# Patient Record
Sex: Female | Born: 1990 | Race: White | Hispanic: No | Marital: Married | Smoking: Never smoker
Health system: Southern US, Community
[De-identification: ages and names within clinical notes are randomized; demographics above are authoritative.]

## PROBLEM LIST (undated history)

## (undated) HISTORY — PX: TONSILLECTOMY: SUR1361

---

## 2017-09-13 DIAGNOSIS — Z124 Encounter for screening for malignant neoplasm of cervix: Secondary | ICD-10-CM | POA: Diagnosis not present

## 2018-02-09 NOTE — L&D Delivery Note (Addendum)
Patient is 28 y.o. G1P0 [redacted]w[redacted]d admitted for ROM.   Delivery Note At 3:11 AM a viable female was delivered via Vaginal, Spontaneous (Presentation: cephalic; LOA).  APGAR: 6, 9.  Placenta status: Spontaneous, intact.  Cord: 3VC.  Anesthesia:  epidural Episiotomy: None Lacerations: small hemostatic 1st degree lacerations of the left sulcus and right periurethral area. No sutures necessary. Suture Repair: none Est. Blood Loss (mL): 42mL   Mom to postpartum.  Baby to Couplet care / Skin to Skin.  Upon arrival patient was complete and pushing. She pushed with good maternal effort to deliver a healthy baby boy. Baby delivered without difficulty, was noted to have good tone and place on maternal abdomen for oral suctioning, drying and stimulation. After roughly 30 seconds of vigorous stimulation, he had not demonstrated any cry or inhalation, he was brought the warmer for further resuscitation.  Following further efforts to suction, and warm him in addition to blow by oxygen, he demonstrated a vigorous cry and improved APGAR score.  He was brought back to mom.  Delayed cord clamping performed. Placenta delivered intact with 3V cord. Vaginal canal and perineum was inspected and found to be hemostatic. Pitocin was started and uterus massaged until bleeding slowed. Counts of sharps, instruments, and lap pads were all correct.   Matilde Haymaker, MD PGY-2 8/23/20203:32 AM   I was present and gloved with the resident for the entire delivery. I agree with the above note.   Marcille Buffy DNP, CNM  10/02/18  4:09 AM

## 2018-02-28 ENCOUNTER — Encounter: Payer: Self-pay | Admitting: *Deleted

## 2018-03-02 ENCOUNTER — Encounter: Payer: Self-pay | Admitting: Obstetrics and Gynecology

## 2018-03-02 ENCOUNTER — Ambulatory Visit (INDEPENDENT_AMBULATORY_CARE_PROVIDER_SITE_OTHER): Payer: BLUE CROSS/BLUE SHIELD | Admitting: Obstetrics and Gynecology

## 2018-03-02 VITALS — BP 110/63 | HR 84 | Ht 64.0 in | Wt 131.0 lb

## 2018-03-02 DIAGNOSIS — Z3400 Encounter for supervision of normal first pregnancy, unspecified trimester: Secondary | ICD-10-CM

## 2018-03-02 DIAGNOSIS — Z3A10 10 weeks gestation of pregnancy: Secondary | ICD-10-CM

## 2018-03-02 DIAGNOSIS — Z3401 Encounter for supervision of normal first pregnancy, first trimester: Secondary | ICD-10-CM | POA: Diagnosis not present

## 2018-03-02 DIAGNOSIS — Z113 Encounter for screening for infections with a predominantly sexual mode of transmission: Secondary | ICD-10-CM | POA: Diagnosis not present

## 2018-03-02 NOTE — Patient Instructions (Signed)
AREA PEDIATRIC/FAMILY PRACTICE PHYSICIANS  [facility] [address], [address] [place], [place]  [address] Phone - [phone]   Fax - [phone]  [facility] [address] [place], [place] [address] Phone - [phone]   Fax - [phone]  [facility] [address] [place], [place]  [address] Phone - [phone]   Fax - [phone]  [facility] [address] [place], [place]  [address] Phone - [phone]   Fax - [phone]  [facility] [address] [place], [place]  [address] Phone - [phone]   Fax - [phone]  [facility] [address] [place], [place]  [address] Phone - [phone]   Fax - [phone]  [facility] [address] [place], [place]  [address] Phone - [phone]   Fax - [phone]  [facility] [address], [address] [place], [place]  [address] Phone - [phone]   Fax - [phone]  [facility] [address] [place], [place]  [address] Phone - [phone]   Fax - [phone] [facility] [address] [place], [place]  [address] Phone - [phone]   Fax - [phone]  [facility] [address] [place], [place]  [address] Phone - [phone]   Fax - [phone]  [facility] [address] [place], [place]  [address] Phone - [phone]   Fax - [phone]  [facility] [address] [place], [place]  [address] Phone - [phone]   Fax - [phone]  [facility] GOSRANI [address] [place], [place]  [address] Phone - [phone]  [facility] [address] [place], [place]  [address] Phone - [phone]   Fax - [phone]  [facility] [address] [place], [place]  [address] Phone - [phone]   Fax - [phone]  [facility] [address] [place], [place]  [address] Phone - [phone]   Fax - [phone]  [facility] [address] [place], [place]  [address] Phone - [phone]   Fax - [phone]   [facility] [address] [place], [place]  [address] Phone - [phone]   Fax - [phone]  [facility] [address] [place], [place]  [address] Phone - [phone]   Fax - [phone]  DAVID RUBIN [address] [place], [place]  [address] Phone - [phone]   Fax - [phone]  [facility] [address] [place], [place]  [address] Phone - [phone]   Fax - [phone]  [facility] [address] [place], [place]  [address] Phone - [phone]   Fax - [phone] [facility] [address] [place], [place]  [address] Phone - [phone]   Fax - [phone]  [facility] [address] [place], [place]  [address] Phone - [phone]   Fax - [phone]  [facility] [address] [place],   [address] Phone - [phone]   Fax - [phone]  [facility] Charlene Flemming MD [address] [place]  [address] Phone [phone]  Fax [phone]   

## 2018-03-02 NOTE — Progress Notes (Signed)
Last pap 09/13/17- negative

## 2018-03-02 NOTE — Progress Notes (Signed)
Subjective:   Christy Durham is a 28 y.o. G1P0 at [redacted]w[redacted]d by LMP being seen today for her first obstetrical visit.  Her obstetrical history is significant for healthy female . Patient does intend to breast feed. Pregnancy history fully reviewed. Desired pregnancy.   Patient reports no complaints.  HISTORY: OB History  Gravida Para Term Preterm AB Living  1 0 0 0 0 0  SAB TAB Ectopic Multiple Live Births  0 0 0 0 0    # Outcome Date GA Lbr Len/2nd Weight Sex Delivery Anes PTL Lv  1 Current             Last pap smear was done 2019 and was normal  No past medical history on file.  Family History  Problem Relation Age of Onset  . Cancer Mother 78       Brease  . Heart defect Maternal Uncle    Social History   Tobacco Use  . Smoking status: Never Smoker  . Smokeless tobacco: Never Used  Substance Use Topics  . Alcohol use: Not Currently  . Drug use: Never   No Known Allergies Current Outpatient Medications on File Prior to Visit  Medication Sig Dispense Refill  . Prenatal Vit-Fe Fumarate-FA (PRENATAL COMPLETE PO) Take by mouth.     No current facility-administered medications on file prior to visit.     Review of Systems Pertinent items noted in HPI and remainder of comprehensive ROS otherwise negative.  Exam   Vitals:   03/02/18 1550 03/02/18 1617  BP: 110/63   Pulse: 84   Weight: 131 lb (59.4 kg)   Height:  5\' 4"  (1.626 m)    Fetal Heart Rate (bpm): 160 Bedside Ultrasound for FHR check: Patient informed that the ultrasound is considered a limited obstetric ultrasound and is not intended to be a complete ultrasound exam.  Patient also informed that the ultrasound is not being completed with the intent of assessing for fetal or placental anomalies or any pelvic abnormalities.  Explained that the purpose of today's ultrasound is to assess for fetal heart rate.  Patient acknowledges the purpose of the exam and the limitations of the study.     BP 110/63    Pulse 84   Ht 5\' 4"  (1.626 m)   Wt 131 lb (59.4 kg)   LMP 12/21/2017   BMI 22.49 kg/m  Uterine Size: size equals dates  System: Breast:  No nipple retraction or dimpling, No nipple discharge or bleeding, No axillary or supraclavicular adenopathy, Normal to palpation without dominant masses   Skin: normal coloration and turgor, no rashes    Neurologic: negative   Extremities: normal strength, tone, and muscle mass   HEENT neck supple with midline trachea and thyroid without masses   Mouth/Teeth mucous membranes moist, pharynx normal without lesions   Neck supple and no masses   Cardiovascular: regular rate and rhythm, no murmurs or gallops   Respiratory:  appears well, vitals normal, no respiratory distress, acyanotic, normal RR, neck free of mass or lymphadenopathy, chest clear, no wheezing, crepitations, rhonchi, normal symmetric air entry   Abdomen: soft, non-tender; bowel sounds normal; no masses,  no organomegaly    Assessment:   Pregnancy: G1P0 Patient Active Problem List   Diagnosis Date Noted  . Supervision of low-risk first pregnancy 03/02/2018     Plan:  1. Supervision of normal first pregnancy, antepartum  - Obstetric panel - HIV antibody (with reflex) - Culture, OB Urine - GC/Chlamydia probe  amp ([place])not at [facility] - [place] bedside; Future - Declines SMA, Cystic fibrosis, and Hemoglobin electrophoresis  - Requested NIPS and AFP  Initial labs drawn. Continue prenatal vitamins. Genetic Screening discussed, NIPS: requested. Ultrasound discussed; fetal anatomic survey: requested. Problem list reviewed and updated. The nature of [facility] with multiple MDs and other Advanced Practice Providers was explained to patient; also emphasized that residents, students are part of our team. Routine obstetric precautions reviewed.   Return in about 5 weeks (around 04/06/2018) for For genetic testing .   Blaze Nylund, Harolyn Rutherford, NP Faculty  Practice Center for [employer], [facility]

## 2018-03-03 ENCOUNTER — Encounter: Payer: Self-pay | Admitting: Obstetrics & Gynecology

## 2018-03-03 LAB — OBSTETRIC PANEL
Absolute Monocytes: 842 cells/uL (ref 200–950)
Antibody Screen: NOT DETECTED
Basophils Absolute: 43 cells/uL (ref 0–200)
Basophils Relative: 0.4 %
Eosinophils Absolute: 119 cells/uL (ref 15–500)
Eosinophils Relative: 1.1 %
HCT: 37.4 % (ref 35.0–45.0)
Hemoglobin: 12.9 g/dL (ref 11.7–15.5)
Hepatitis B Surface Ag: NONREACTIVE
Lymphs Abs: 2430 cells/uL (ref 850–3900)
MCH: 33 pg (ref 27.0–33.0)
MCHC: 34.5 g/dL (ref 32.0–36.0)
MCV: 95.7 fL (ref 80.0–100.0)
MPV: 11.6 fL (ref 7.5–12.5)
Monocytes Relative: 7.8 %
Neutro Abs: 7366 cells/uL (ref 1500–7800)
Neutrophils Relative %: 68.2 %
Platelets: 291 10*3/uL (ref 140–400)
RBC: 3.91 10*6/uL (ref 3.80–5.10)
RDW: 11.9 % (ref 11.0–15.0)
RPR Ser Ql: NONREACTIVE
Rubella: 1.37 index
Total Lymphocyte: 22.5 %
WBC: 10.8 10*3/uL (ref 3.8–10.8)

## 2018-03-03 LAB — HIV ANTIBODY (ROUTINE TESTING W REFLEX): HIV 1&2 Ab, 4th Generation: NONREACTIVE

## 2018-03-03 LAB — GC/CHLAMYDIA PROBE AMP (~~LOC~~) NOT AT ARMC
Chlamydia: NEGATIVE
Neisseria Gonorrhea: NEGATIVE

## 2018-03-04 LAB — URINE CULTURE, OB REFLEX: Organism ID, Bacteria: NO GROWTH

## 2018-03-04 LAB — CULTURE, OB URINE

## 2018-04-06 ENCOUNTER — Encounter: Payer: BLUE CROSS/BLUE SHIELD | Admitting: Obstetrics and Gynecology

## 2018-04-11 ENCOUNTER — Encounter: Payer: Self-pay | Admitting: Obstetrics & Gynecology

## 2018-04-11 ENCOUNTER — Ambulatory Visit (INDEPENDENT_AMBULATORY_CARE_PROVIDER_SITE_OTHER): Payer: BLUE CROSS/BLUE SHIELD | Admitting: Obstetrics & Gynecology

## 2018-04-11 VITALS — BP 123/75 | HR 79 | Wt 135.0 lb

## 2018-04-11 DIAGNOSIS — Z3A15 15 weeks gestation of pregnancy: Secondary | ICD-10-CM | POA: Diagnosis not present

## 2018-04-11 DIAGNOSIS — Z3482 Encounter for supervision of other normal pregnancy, second trimester: Secondary | ICD-10-CM

## 2018-04-11 DIAGNOSIS — Z3480 Encounter for supervision of other normal pregnancy, unspecified trimester: Secondary | ICD-10-CM | POA: Diagnosis not present

## 2018-04-11 NOTE — Progress Notes (Signed)
   PRENATAL VISIT NOTE  Subjective:  Christy Durham is a 28 y.o. G1P0 at [redacted]w[redacted]d being seen today for ongoing prenatal care.  She is currently monitored for the following issues for this low-risk pregnancy and has Supervision of low-risk first pregnancy on their problem list.  Patient reports no complaints.  Contractions: Not present. Vag. Bleeding: None.  Movement: Absent. Denies leaking of fluid.   The following portions of the patient's history were reviewed and updated as appropriate: allergies, current medications, past family history, past medical history, past social history, past surgical history and problem list. Problem list updated.  Objective:   Vitals:   04/11/18 1031  BP: 123/75  Pulse: 79  Weight: 135 lb (61.2 kg)    Fetal Status: Fetal Heart Rate (bpm): 150   Movement: Absent     General:  Alert, oriented and cooperative. Patient is in no acute distress.  Skin: Skin is warm and dry. No rash noted.   Cardiovascular: Normal heart rate noted  Respiratory: Normal respiratory effort, no problems with respiration noted  Abdomen: Soft, gravid, appropriate for gestational age.  Pain/Pressure: Absent     Pelvic: Cervical exam deferred        Extremities: Normal range of motion.  Edema: None  Mental Status: Normal mood and affect. Normal behavior. Normal judgment and thought content.   Assessment and Plan:  Pregnancy: G1P0 at [redacted]w[redacted]d  1. Supervision of other normal pregnancy, antepartum - Genetic Screening - Alpha fetoprotein, maternal - [place] MFM OB DETAIL +14 WK; Future  Preterm labor symptoms and general obstetric precautions including but not limited to vaginal bleeding, contractions, leaking of fluid and fetal movement were reviewed in detail with the patient. Please refer to After Visit Summary for other counseling recommendations.  Return in about 4 weeks (around 05/09/2018).  No future appointments.  Elsie Lincoln, MD

## 2018-04-12 LAB — ALPHA FETOPROTEIN, MATERNAL
AFP MoM: 1.42
AFP, Serum: 48.8 ng/mL
CALC'D GESTATIONAL AGE: 15.9 wk
Maternal Wt: 135 [lb_av]
Risk for ONTD: 1
Twins-AFP: 1

## 2018-04-18 DIAGNOSIS — Z3400 Encounter for supervision of normal first pregnancy, unspecified trimester: Secondary | ICD-10-CM

## 2018-04-27 ENCOUNTER — Encounter (HOSPITAL_COMMUNITY): Payer: Self-pay

## 2018-05-06 ENCOUNTER — Ambulatory Visit (HOSPITAL_COMMUNITY): Payer: BLUE CROSS/BLUE SHIELD

## 2018-05-09 ENCOUNTER — Ambulatory Visit (INDEPENDENT_AMBULATORY_CARE_PROVIDER_SITE_OTHER): Payer: BLUE CROSS/BLUE SHIELD | Admitting: Obstetrics & Gynecology

## 2018-05-09 ENCOUNTER — Other Ambulatory Visit: Payer: Self-pay

## 2018-05-09 DIAGNOSIS — Z3A19 19 weeks gestation of pregnancy: Secondary | ICD-10-CM

## 2018-05-09 DIAGNOSIS — Z3402 Encounter for supervision of normal first pregnancy, second trimester: Secondary | ICD-10-CM

## 2018-05-09 NOTE — Progress Notes (Signed)
   PRENATAL VISIT NOTE  Subjective:  Christy Durham is a 28 y.o. G1P0 at [redacted]w[redacted]d being seen today for ongoing prenatal care.  She is currently monitored for the following issues for this low-risk pregnancy and has Supervision of low-risk first pregnancy on their problem list.  Patient reports no complaints.  Contractions: Not present. Vag. Bleeding: None.  Movement: Present. Denies leaking of fluid.   The following portions of the patient's history were reviewed and updated as appropriate: allergies, current medications, past family history, past medical history, past social history, past surgical history and problem list.   Objective:   Vitals:   05/09/18 1606  BP: 110/70  Pulse: 72  Weight: 143 lb (64.9 kg)    Fetal Status: Fetal Heart Rate (bpm): 151   Movement: Present     General:  Alert, oriented and cooperative. Patient is in no acute distress.  Skin: Skin is warm and dry. No rash noted.   Cardiovascular: Normal heart rate noted  Respiratory: Normal respiratory effort, no problems with respiration noted  Abdomen: Soft, gravid, appropriate for gestational age.  Pain/Pressure: Absent     Pelvic: Cervical exam deferred        Extremities: Normal range of motion.  Edema: None  Mental Status: Normal mood and affect. Normal behavior. Normal judgment and thought content.   Assessment and Plan:  Pregnancy: G1P0 at [redacted]w[redacted]d 1. Encounter for supervision of low-risk first pregnancy in second trimester  Preterm labor symptoms and general obstetric precautions including but not limited to vaginal bleeding, contractions, leaking of fluid and fetal movement were reviewed in detail with the patient. Please refer to After Visit Summary for other counseling recommendations.   Return in about 8 weeks (around 07/04/2018) for for 2 hour GTT.  Future Appointments  Date Time Provider Department Center  05/27/2018  3:00 PM [facility]-MFC NURSE [facility]-US  05/27/2018  3:00 PM WH-[facility] [place] 3 WH-MFCUS M[facility]-US     Allie Bossier, MD

## 2018-05-27 ENCOUNTER — Ambulatory Visit (HOSPITAL_COMMUNITY)
Admission: RE | Admit: 2018-05-27 | Discharge: 2018-05-27 | Disposition: A | Discharge status: Home / Self Care | Payer: BLUE CROSS/BLUE SHIELD | Source: Ambulatory Visit | Attending: Obstetrics and Gynecology | Admitting: Obstetrics and Gynecology

## 2018-05-27 ENCOUNTER — Ambulatory Visit (HOSPITAL_COMMUNITY): Payer: BLUE CROSS/BLUE SHIELD

## 2018-05-27 ENCOUNTER — Other Ambulatory Visit: Payer: Self-pay | Admitting: Obstetrics & Gynecology

## 2018-05-27 ENCOUNTER — Other Ambulatory Visit: Payer: Self-pay

## 2018-05-27 DIAGNOSIS — Z3480 Encounter for supervision of other normal pregnancy, unspecified trimester: Secondary | ICD-10-CM

## 2018-05-27 DIAGNOSIS — Z3A22 22 weeks gestation of pregnancy: Secondary | ICD-10-CM

## 2018-05-27 DIAGNOSIS — Z363 Encounter for antenatal screening for malformations: Secondary | ICD-10-CM | POA: Diagnosis not present

## 2018-07-08 ENCOUNTER — Other Ambulatory Visit: Payer: Self-pay

## 2018-07-08 ENCOUNTER — Ambulatory Visit (INDEPENDENT_AMBULATORY_CARE_PROVIDER_SITE_OTHER): Payer: BLUE CROSS/BLUE SHIELD

## 2018-07-08 VITALS — BP 108/63 | HR 85 | Wt 152.0 lb

## 2018-07-08 DIAGNOSIS — Z3402 Encounter for supervision of normal first pregnancy, second trimester: Secondary | ICD-10-CM | POA: Diagnosis not present

## 2018-07-08 DIAGNOSIS — Z23 Encounter for immunization: Secondary | ICD-10-CM

## 2018-07-08 NOTE — Progress Notes (Signed)
   TELEHEALTH VIRTUAL OBSTETRICS VISIT ENCOUNTER NOTE  I connected with Samuel Germany on 07/08/18 at  8:15 AM EDT by WebEx at home and verified that I am speaking with the correct person using two identifiers.   I discussed the limitations, risks, security and privacy concerns of performing an evaluation and management service by telephone and the availability of in person appointments. I also discussed with the patient that there may be a patient responsible charge related to this service. The patient expressed understanding and agreed to proceed.  Subjective:  Christy Durham is a 28 y.o. G1P0 at [redacted]w[redacted]d being followed for ongoing prenatal care.  She is currently monitored for the following issues for this low-risk pregnancy and has Supervision of low-risk first pregnancy on their problem list.  Patient reports no complaints. Reports fetal movement. Denies any contractions, bleeding or leaking of fluid.   The following portions of the patient's history were reviewed and updated as appropriate: allergies, current medications, past family history, past medical history, past social history, past surgical history and problem list.   Objective:   General:  Alert, oriented and cooperative.   Mental Status: Normal mood and affect perceived. Normal judgment and thought content.  Rest of physical exam deferred due to type of encounter  Assessment and Plan:  Pregnancy: G1P0 at [redacted]w[redacted]d 1. Encounter for supervision of normal first pregnancy in second trimester -BP 108/63 -Encouraged patient to do virtual tour of hospital and sign up for virtual prenatal classes -Anticipatory guidance of next visits reviewed with patient  - 2Hr GTT w/ 1 Hr [place] 75 g - CBC - HIV antibody (with reflex) - RPR   Preterm labor symptoms and general obstetric precautions including but not limited to vaginal bleeding, contractions, leaking of fluid and fetal movement were reviewed in detail with the patient.  I discussed  the assessment and treatment plan with the patient. The patient was provided an opportunity to ask questions and all were answered. The patient agreed with the plan and demonstrated an understanding of the instructions. The patient was advised to call back or seek an in-person office evaluation/go to [facility]U at [facility] for any urgent or concerning symptoms. Please refer to After Visit Summary for other counseling recommendations.   I provided 10 minutes of non-face-to-face time during this encounter.  Return in about 4 weeks (around 08/05/2018) for Return OB visit.  No future appointments.  Rolm Bookbinder, CNM Center for [employer], [facility]

## 2018-07-11 ENCOUNTER — Other Ambulatory Visit: Payer: Self-pay | Admitting: *Deleted

## 2018-07-11 LAB — CBC
HCT: 37.1 % (ref 35.0–45.0)
Hemoglobin: 12.9 g/dL (ref 11.7–15.5)
MCH: 34.2 pg — ABNORMAL HIGH (ref 27.0–33.0)
MCHC: 34.8 g/dL (ref 32.0–36.0)
MCV: 98.4 fL (ref 80.0–100.0)
MPV: 11.4 fL (ref 7.5–12.5)
Platelets: 227 10*3/uL (ref 140–400)
RBC: 3.77 10*6/uL — ABNORMAL LOW (ref 3.80–5.10)
RDW: 12.1 % (ref 11.0–15.0)
WBC: 10.2 10*3/uL (ref 3.8–10.8)

## 2018-07-11 LAB — 2HR GTT W 1 HR, CARPENTER, 75 G
Glucose, 1 Hr, Gest: 83 mg/dL (ref 65–179)
Glucose, 2 Hr, Gest: 102 mg/dL (ref 65–152)
Glucose, Fasting, Gest: 76 mg/dL (ref 65–91)

## 2018-07-11 LAB — RPR: RPR Ser Ql: NONREACTIVE

## 2018-07-11 LAB — HIV ANTIBODY (ROUTINE TESTING W REFLEX): HIV 1&2 Ab, 4th Generation: NONREACTIVE

## 2018-08-05 ENCOUNTER — Other Ambulatory Visit: Payer: Self-pay

## 2018-08-05 ENCOUNTER — Telehealth (INDEPENDENT_AMBULATORY_CARE_PROVIDER_SITE_OTHER): Payer: BC Managed Care – PPO

## 2018-08-05 DIAGNOSIS — Z3400 Encounter for supervision of normal first pregnancy, unspecified trimester: Secondary | ICD-10-CM

## 2018-08-05 DIAGNOSIS — Z3403 Encounter for supervision of normal first pregnancy, third trimester: Secondary | ICD-10-CM

## 2018-08-05 NOTE — Progress Notes (Signed)
   TELEHEALTH VIRTUAL OBSTETRICS VISIT ENCOUNTER NOTE  I connected with Christy Durham on 08/05/18 at  8:45 AM EDT by MyChart Virtual Visit at home and verified that I am speaking with the correct person using two identifiers.   I discussed the limitations, risks, security and privacy concerns of performing an evaluation and management service by telephone and the availability of in person appointments. I also discussed with the patient that there may be a patient responsible charge related to this service. The patient expressed understanding and agreed to proceed.  Subjective:  Christy Durham is a 28 y.o. G1P0 at [redacted]w[redacted]d being followed for ongoing prenatal care.  She is currently monitored for the following issues for this low-risk pregnancy and has Supervision of low-risk first pregnancy on their problem list.  Patient reports increased swelling in feet after work and restless legs at night. Reports fetal movement. Denies any contractions, bleeding or leaking of fluid.   The following portions of the patient's history were reviewed and updated as appropriate: allergies, current medications, past family history, past medical history, past social history, past surgical history and problem list.   Objective:   General:  Alert, oriented and cooperative.   Mental Status: Normal mood and affect perceived. Normal judgment and thought content.  Rest of physical exam deferred due to type of encounter  Assessment and Plan:  Pregnancy: G1P0 at [redacted]w[redacted]d 1. Encounter for supervision of low-risk first pregnancy, antepartum -Patient at work and unable to check BP. Will check tonight and put in [place]. Reviewed previous BPs and all normal -Discussed coping measures for RLS and encouraged patient to increase PO hydration. -Reassured of normalcy of swelling after being on her feet for long hours. Warning signs reviewed with patient -Anticipatory guidance of next visits reviewed with patient including GBS testing.    Preterm labor symptoms and general obstetric precautions including but not limited to vaginal bleeding, contractions, leaking of fluid and fetal movement were reviewed in detail with the patient.  I discussed the assessment and treatment plan with the patient. The patient was provided an opportunity to ask questions and all were answered. The patient agreed with the plan and demonstrated an understanding of the instructions. The patient was advised to call back or seek an in-person office evaluation/go to MAU at [facility] for any urgent or concerning symptoms. Please refer to After Visit Summary for other counseling recommendations.   I provided 20 minutes of non-face-to-face time during this encounter.  Return in about 4 weeks (around 09/02/2018) for Return OB visit.  No future appointments.  Wende Mott, [place] for [employer], [place]

## 2018-08-05 NOTE — Progress Notes (Signed)
Pt cannot take BP right now because she is at work but she will take it when she gets home

## 2018-09-02 ENCOUNTER — Other Ambulatory Visit: Payer: Self-pay

## 2018-09-02 ENCOUNTER — Ambulatory Visit (INDEPENDENT_AMBULATORY_CARE_PROVIDER_SITE_OTHER): Payer: BC Managed Care – PPO | Admitting: Certified Nurse Midwife

## 2018-09-02 VITALS — BP 108/61 | HR 83 | Wt 161.0 lb

## 2018-09-02 DIAGNOSIS — Z3403 Encounter for supervision of normal first pregnancy, third trimester: Secondary | ICD-10-CM | POA: Diagnosis not present

## 2018-09-02 DIAGNOSIS — Z113 Encounter for screening for infections with a predominantly sexual mode of transmission: Secondary | ICD-10-CM

## 2018-09-02 DIAGNOSIS — Z3A36 36 weeks gestation of pregnancy: Secondary | ICD-10-CM

## 2018-09-02 NOTE — Progress Notes (Signed)
Subjective:  Christy Durham is a 28 y.o. G1P0 at [redacted]w[redacted]d being seen today for ongoing prenatal care.  She is currently monitored for the following issues for this low-risk pregnancy and has Supervision of low-risk first pregnancy on their problem list.  Patient reports no complaints.  Contractions: Not present. Vag. Bleeding: None.  Movement: Present. Denies leaking of fluid.   The following portions of the patient's history were reviewed and updated as appropriate: allergies, current medications, past family history, past medical history, past social history, past surgical history and problem list. Problem list updated.  Objective:   Vitals:   09/02/18 0819  BP: 108/61  Pulse: 83  Weight: 161 lb (73 kg)    Fetal Status: Fetal Heart Rate (bpm): 148 Fundal Height: 37 cm Movement: Present  Presentation: Vertex  General:  Alert, oriented and cooperative. Patient is in no acute distress.  Skin: Skin is warm and dry. No rash noted.   Cardiovascular: Normal heart rate noted  Respiratory: Normal respiratory effort, no problems with respiration noted  Abdomen: Soft, gravid, appropriate for gestational age. Pain/Pressure: Present     Pelvic: Vag. Bleeding: None Vag D/C Character: Thin   Cervical exam deferred        Extremities: Normal range of motion.  Edema: Trace  Mental Status: Normal mood and affect. Normal behavior. Normal judgment and thought content.   Urinalysis:      Assessment and Plan:  Pregnancy: G1P0 at 104w3d  1. Encounter for supervision of normal first pregnancy in third trimester - Culture, beta strep (group b only) - Cervicovaginal ancillary only( [place])   Term labor symptoms and general obstetric precautions including but not limited to vaginal bleeding, contractions, leaking of fluid and fetal movement were reviewed in detail with the patient. Please refer to After Visit Summary for other counseling recommendations.  Return in about 1 week (around  09/09/2018).   Julianne Handler, CNM

## 2018-09-03 LAB — CERVICOVAGINAL ANCILLARY ONLY
Chlamydia: NEGATIVE
Neisseria Gonorrhea: NEGATIVE

## 2018-09-05 LAB — CULTURE, BETA STREP (GROUP B ONLY)
MICRO NUMBER:: 701467
SPECIMEN QUALITY:: ADEQUATE

## 2018-09-09 ENCOUNTER — Encounter: Payer: Self-pay | Admitting: Certified Nurse Midwife

## 2018-09-09 ENCOUNTER — Telehealth (INDEPENDENT_AMBULATORY_CARE_PROVIDER_SITE_OTHER): Payer: BC Managed Care – PPO | Admitting: Certified Nurse Midwife

## 2018-09-09 ENCOUNTER — Other Ambulatory Visit: Payer: Self-pay

## 2018-09-09 DIAGNOSIS — Z3A37 37 weeks gestation of pregnancy: Secondary | ICD-10-CM

## 2018-09-09 DIAGNOSIS — Z3403 Encounter for supervision of normal first pregnancy, third trimester: Secondary | ICD-10-CM

## 2018-09-09 NOTE — Progress Notes (Signed)
Pt will take BP when she gets home from work and she will put it into [employer].

## 2018-09-09 NOTE — Progress Notes (Signed)
   [place] VIRTUAL VIDEO VISIT ENCOUNTER NOTE  Provider location: Center for [employer] at [place]   I connected with Christy Durham on 09/09/18 at 10:45 AM EDT by MyChart Video Encounter at home and verified that I am speaking with the correct person using two identifiers.   I discussed the limitations, risks, security and privacy concerns of performing an evaluation and management service virtually and the availability of in person appointments. I also discussed with the patient that there may be a patient responsible charge related to this service. The patient expressed understanding and agreed to proceed. Subjective:  Christy Durham is a 28 y.o. G1P0 at [redacted]w[redacted]d being seen today for ongoing prenatal care.  She is currently monitored for the following issues for this low-risk pregnancy and has Supervision of low-risk first pregnancy on their problem list.  Patient reports no complaints.  Contractions: Not present. Vag. Bleeding: None.  Movement: Present. Denies any leaking of fluid.   The following portions of the patient's history were reviewed and updated as appropriate: allergies, current medications, past family history, past medical history, past social history, past surgical history and problem list.   Objective:  There were no vitals filed for this visit. - did not have cuff with her, will check BP later today   Fetal Status:     Movement: Present     General:  Alert, oriented and cooperative. Patient is in no acute distress.  Respiratory: Normal respiratory effort, no problems with respiration noted  Mental Status: Normal mood and affect. Normal behavior. Normal judgment and thought content.  Rest of physical exam deferred due to type of encounter  Imaging: No results found.  Assessment and Plan:  Pregnancy: G1P0 at [redacted]w[redacted]d 1. Encounter for supervision of low-risk first pregnancy in third trimester  Term labor symptoms and general obstetric  precautions including but not limited to vaginal bleeding, contractions, leaking of fluid and fetal movement were reviewed in detail with the patient. I discussed the assessment and treatment plan with the patient. The patient was provided an opportunity to ask questions and all were answered. The patient agreed with the plan and demonstrated an understanding of the instructions. The patient was advised to call back or seek an in-person office evaluation/go to [facility]U at [facility] for any urgent or concerning symptoms. Please refer to After Visit Summary for other counseling recommendations.   I provided 6 minutes of face-to-face time during this encounter.  Return in about 1 week (around 09/16/2018).- virtual  No future appointments.  Christy Durham, [place] for [place]

## 2018-09-16 ENCOUNTER — Telehealth (INDEPENDENT_AMBULATORY_CARE_PROVIDER_SITE_OTHER): Payer: BC Managed Care – PPO

## 2018-09-16 DIAGNOSIS — Z3403 Encounter for supervision of normal first pregnancy, third trimester: Secondary | ICD-10-CM

## 2018-09-16 DIAGNOSIS — Z3A38 38 weeks gestation of pregnancy: Secondary | ICD-10-CM

## 2018-09-16 NOTE — Progress Notes (Signed)
   TELEHEALTH VIRTUAL OBSTETRICS VISIT ENCOUNTER NOTE  I connected with Christy Durham on 09/16/18 at  8:45 AM EDT by MyChart Virtual Visit at home and verified that I am speaking with the correct person using two identifiers.   I discussed the limitations, risks, security and privacy concerns of performing an evaluation and management service by telephone and the availability of in person appointments. I also discussed with the patient that there may be a patient responsible charge related to this service. The patient expressed understanding and agreed to proceed.  Subjective:  Christy Durham is a 28 y.o. G1P0 at [redacted]w[redacted]d being followed for ongoing prenatal care.  She is currently monitored for the following issues for this low-risk pregnancy and has Supervision of low-risk first pregnancy on their problem list.  Patient reports no complaints. Reports fetal movement. Denies any contractions, bleeding or leaking of fluid.   The following portions of the patient's history were reviewed and updated as appropriate: allergies, current medications, past family history, past medical history, past social history, past surgical history and problem list.   Objective:   General:  Alert, oriented and cooperative.   Mental Status: Normal mood and affect perceived. Normal judgment and thought content.  Rest of physical exam deferred due to type of encounter  Assessment and Plan:  Pregnancy: G1P0 at [redacted]w[redacted]d 1. Encounter for supervision of low-risk first pregnancy in third trimester -BP 113/75 -Patient doing well without complaints -Counseled patient on routine COVID-19 testing for all admission to inpatient units whether direct admit or from MAU/ED. Reviewed reasons for testing including identifying cases and protecting patients/staff from possible infection. Reassured patient that separation from newborn is not required for COVID+ tests in asymptomatic patients and that the plan of care will be created with Peds  through shared decision-making. One support person is allowed for all admitted patients regardless of COVID test results. All patient questions answered.   -Reviewed schedule of visits for remainder of pregnancy  Term labor symptoms and general obstetric precautions including but not limited to vaginal bleeding, contractions, leaking of fluid and fetal movement were reviewed in detail with the patient.  I discussed the assessment and treatment plan with the patient. The patient was provided an opportunity to ask questions and all were answered. The patient agreed with the plan and demonstrated an understanding of the instructions. The patient was advised to call back or seek an in-person office evaluation/go to MAU at [facility] for any urgent or concerning symptoms. Please refer to After Visit Summary for other counseling recommendations.   I provided 10 minutes of non-face-to-face time during this encounter.  Return in about 1 week (around 09/23/2018) for Return OB visit.  Future Appointments  Date Time Provider [place]  09/23/2018  9:30 AM Julianne Handler, [place], [place] for [employer], [place]

## 2018-09-23 ENCOUNTER — Telehealth (INDEPENDENT_AMBULATORY_CARE_PROVIDER_SITE_OTHER): Payer: BC Managed Care – PPO | Admitting: Certified Nurse Midwife

## 2018-09-23 DIAGNOSIS — Z3403 Encounter for supervision of normal first pregnancy, third trimester: Secondary | ICD-10-CM

## 2018-09-23 DIAGNOSIS — Z3A39 39 weeks gestation of pregnancy: Secondary | ICD-10-CM

## 2018-09-23 NOTE — Progress Notes (Signed)
Pt will take BP and enter into Babyscripts today

## 2018-09-23 NOTE — Progress Notes (Addendum)
   TELEHEALTH VIRTUAL OBSTETRICS VISIT ENCOUNTER NOTE  I connected with Christy Durham on 09/23/18 at  9:30 AM EDT by telephone at home and verified that I am speaking with the correct person using two identifiers.   I discussed the limitations, risks, security and privacy concerns of performing an evaluation and management service by telephone and the availability of in person appointments. I also discussed with the patient that there may be a patient responsible charge related to this service. The patient expressed understanding and agreed to proceed.  Subjective:  Christy Durham is a 28 y.o. G1P0 at [redacted]w[redacted]d being followed for ongoing prenatal care.  She is currently monitored for the following issues for this low-risk pregnancy and has Supervision of low-risk first pregnancy on their problem list.  Patient reports no complaints. Had some pain her hips earlier in the week when she woke up, but none now. Reports fetal movement. Denies any contractions, bleeding or leaking of fluid.   The following portions of the patient's history were reviewed and updated as appropriate: allergies, current medications, past family history, past medical history, past social history, past surgical history and problem list.   Objective:  BP 116/70 General:  Alert, oriented and cooperative.   Mental Status: Normal mood and affect perceived. Normal judgment and thought content.  Rest of physical exam deferred due to type of encounter  Assessment and Plan:  Pregnancy: G1P0 at [redacted]w[redacted]d 1. Encounter for supervision of low-risk first pregnancy in third trimester -- Follow up in 1 week for NST/AFI -- Plan for IOL at 41 weeks  Term labor symptoms and general obstetric precautions including but not limited to vaginal bleeding, contractions, leaking of fluid and fetal movement were reviewed in detail with the patient.  I discussed the assessment and treatment plan with the patient. The patient was provided an opportunity to  ask questions and all were answered. The patient agreed with the plan and demonstrated an understanding of the instructions. The patient was advised to call back or seek an in-person office evaluation/go to MAU at [facility] for any urgent or concerning symptoms. Please refer to After Visit Summary for other counseling recommendations.   I provided 9 minutes of non-face-to-face time during this encounter.  Return in about 1 week (around 09/30/2018).  No future appointments.  Maryagnes Amos, [place] for [employer], [place]

## 2018-09-27 ENCOUNTER — Other Ambulatory Visit: Payer: Self-pay

## 2018-09-27 ENCOUNTER — Ambulatory Visit (INDEPENDENT_AMBULATORY_CARE_PROVIDER_SITE_OTHER): Payer: BC Managed Care – PPO | Admitting: Advanced Practice Midwife

## 2018-09-27 ENCOUNTER — Encounter (HOSPITAL_COMMUNITY): Payer: Self-pay | Admitting: *Deleted

## 2018-09-27 ENCOUNTER — Telehealth (HOSPITAL_COMMUNITY): Payer: Self-pay | Admitting: *Deleted

## 2018-09-27 VITALS — BP 116/74 | HR 80 | Wt 165.0 lb

## 2018-09-27 DIAGNOSIS — Z3A40 40 weeks gestation of pregnancy: Secondary | ICD-10-CM | POA: Diagnosis not present

## 2018-09-27 DIAGNOSIS — Z3403 Encounter for supervision of normal first pregnancy, third trimester: Secondary | ICD-10-CM | POA: Diagnosis not present

## 2018-09-27 NOTE — Telephone Encounter (Signed)
Preadmission screen  

## 2018-09-27 NOTE — Addendum Note (Signed)
Addended by: [name] A on: 09/27/2018 02:23 PM   Modules accepted: Orders, SmartSet

## 2018-09-27 NOTE — Progress Notes (Signed)
   PRENATAL VISIT NOTE  Subjective:  Christy Durham is a 28 y.o. G1P0 at [redacted]w[redacted]d being seen today for ongoing prenatal care.  She is currently monitored for the following issues for this low-risk pregnancy and has Supervision of low-risk first pregnancy on their problem list.  Patient reports intermittent backache, more at night and pelvic pressure. Contractions: reports tightening in abdomen but no pain. Vag. Bleeding: None. Movement: Present. Denies leaking of fluid.   The following portions of the patient's history were reviewed and updated as appropriate: allergies, current medications, past family history, past medical history, past social history, past surgical history and problem list.   Objective:   Vitals:   09/27/18 1017  BP: 116/74  Pulse: 80  Weight: 74.8 kg    Fetal Status:     Movement: Present  Presentation: Vertex  General:  Alert, oriented and cooperative. Patient is in no acute distress.  Skin: Skin is warm and dry. No rash noted.   Cardiovascular: Normal heart rate noted  Respiratory: Normal respiratory effort, no problems with respiration noted  Abdomen: Soft, gravid, appropriate for gestational age.  Pain/Pressure: Absent     Pelvic: Cervical exam performed FT/80/-1, vertex  Extremities: Normal range of motion.  Edema: Trace  Mental Status: Normal mood and affect. Normal behavior. Normal judgment and thought content.   Assessment and Plan:  Pregnancy: G1P0 at [redacted]w[redacted]d 1. Encounter for supervision of low-risk first pregnancy in third trimester - Cervical exam performed. FT/80/-1, posterior, soft, vertex. - Discussed membrane sweeping, but unable as cervix is FT.  -Discussed the use of evening primrose oil and the [employer] for cervical ripening -IOL scheduled for Tuesday morning 10/04/18 - Pt to come to office Monday afternoon 10/03/18 for foley bulb placement  Term labor symptoms and general obstetric precautions including but not limited to vaginal bleeding,  contractions, leaking of fluid and fetal movement were reviewed in detail with the patient. Please refer to After Visit Summary for other counseling recommendations.   No follow-ups on file.  No future appointments.  Maryagnes Amos, SNM

## 2018-09-30 ENCOUNTER — Other Ambulatory Visit: Payer: Self-pay

## 2018-09-30 ENCOUNTER — Inpatient Hospital Stay (HOSPITAL_COMMUNITY)
Admission: AD | Admit: 2018-09-30 | Discharge: 2018-09-30 | Disposition: A | Discharge status: Home / Self Care | Payer: BC Managed Care – PPO | Source: Home / Self Care | Attending: Obstetrics and Gynecology | Admitting: Obstetrics and Gynecology

## 2018-09-30 ENCOUNTER — Other Ambulatory Visit: Payer: Self-pay | Admitting: Advanced Practice Midwife

## 2018-09-30 ENCOUNTER — Encounter (HOSPITAL_COMMUNITY): Payer: Self-pay | Admitting: *Deleted

## 2018-09-30 DIAGNOSIS — Z20828 Contact with and (suspected) exposure to other viral communicable diseases: Secondary | ICD-10-CM | POA: Diagnosis not present

## 2018-09-30 DIAGNOSIS — Z3A00 Weeks of gestation of pregnancy not specified: Secondary | ICD-10-CM | POA: Insufficient documentation

## 2018-09-30 DIAGNOSIS — Z3403 Encounter for supervision of normal first pregnancy, third trimester: Secondary | ICD-10-CM

## 2018-09-30 DIAGNOSIS — O480 Post-term pregnancy: Secondary | ICD-10-CM | POA: Diagnosis not present

## 2018-09-30 DIAGNOSIS — O479 False labor, unspecified: Secondary | ICD-10-CM | POA: Insufficient documentation

## 2018-09-30 DIAGNOSIS — Z3A41 41 weeks gestation of pregnancy: Secondary | ICD-10-CM | POA: Diagnosis not present

## 2018-09-30 DIAGNOSIS — O321XX0 Maternal care for breech presentation, not applicable or unspecified: Secondary | ICD-10-CM | POA: Diagnosis not present

## 2018-09-30 DIAGNOSIS — Z3A40 40 weeks gestation of pregnancy: Secondary | ICD-10-CM | POA: Diagnosis not present

## 2018-09-30 MED ORDER — ZOLPIDEM TARTRATE 5 MG PO TABS
5.0000 mg | ORAL_TABLET | Freq: Once | ORAL | Status: AC
  Administered 2018-09-30: 23:00:00 5 mg via ORAL
  Filled 2018-09-30: qty 1

## 2018-09-30 MED ORDER — OXYCODONE HCL 5 MG PO TABS
5.0000 mg | ORAL_TABLET | Freq: Once | ORAL | Status: AC
  Administered 2018-09-30: 5 mg via ORAL
  Filled 2018-09-30: qty 1

## 2018-09-30 NOTE — OB Triage Note (Signed)
I have communicated with Dr. Matilde Haymaker, resident and reviewed vital signs:  Vitals:   09/30/18 1953 09/30/18 2236  BP: 135/72 135/72  Pulse: 93 72  Resp: 20   Temp: 97.8 F (36.6 C)     Vaginal exam:  Dilation: 1.5 Effacement (%): 70, 80 Station: -1 Presentation: Vertex Exam by:: Maryagnes Amos, rn,   Also reviewed contraction pattern and that non-stress test is reactive.  It has been documented that patient is contracting every 2-5.5 minutes with no cervical change over 3 hours not indicating active labor.  Patient denies any other complaints.  Based on this report provider has given order for discharge.  A discharge order and diagnosis entered by a provider.   Labor discharge instructions reviewed with patient.

## 2018-09-30 NOTE — MAU Note (Signed)
Contractions since 0200, denies ROM, endorses positive FM

## 2018-09-30 NOTE — Discharge Instructions (Signed)
Braxton Hicks Contractions Contractions of the uterus can occur throughout pregnancy, but they are not always a sign that you are in labor. You may have practice contractions called Braxton Hicks contractions. These false labor contractions are sometimes confused with true labor. What are Braxton Hicks contractions? Braxton Hicks contractions are tightening movements that occur in the muscles of the uterus before labor. Unlike true labor contractions, these contractions do not result in opening (dilation) and thinning of the cervix. Toward the end of pregnancy (32-34 weeks), Braxton Hicks contractions can happen more often and may become stronger. These contractions are sometimes difficult to tell apart from true labor because they can be very uncomfortable. You should not feel embarrassed if you go to the hospital with false labor. Sometimes, the only way to tell if you are in true labor is for your health care provider to look for changes in the cervix. The health care provider will do a physical exam and may monitor your contractions. If you are not in true labor, the exam should show that your cervix is not dilating and your water has not broken. If there are no other health problems associated with your pregnancy, it is completely safe for you to be sent home with false labor. You may continue to have Braxton Hicks contractions until you go into true labor. How to tell the difference between true labor and false labor True labor  Contractions last 30-70 seconds.  Contractions become very regular.  Discomfort is usually felt in the top of the uterus, and it spreads to the lower abdomen and low back.  Contractions do not go away with walking.  Contractions usually become more intense and increase in frequency.  The cervix dilates and gets thinner. False labor  Contractions are usually shorter and not as strong as true labor contractions.  Contractions are usually irregular.  Contractions  are often felt in the front of the lower abdomen and in the groin.  Contractions may go away when you walk around or change positions while lying down.  Contractions get weaker and are shorter-lasting as time goes on.  The cervix usually does not dilate or become thin. Follow these instructions at home:   Take over-the-counter and prescription medicines only as told by your health care provider.  Keep up with your usual exercises and follow other instructions from your health care provider.  Eat and drink lightly if you think you are going into labor.  If Braxton Hicks contractions are making you uncomfortable: ? Change your position from lying down or resting to walking, or change from walking to resting. ? Sit and rest in a tub of warm water. ? Drink enough fluid to keep your urine pale yellow. Dehydration may cause these contractions. ? Do slow and deep breathing several times an hour.  Keep all follow-up prenatal visits as told by your health care provider. This is important. Contact a health care provider if:  You have a fever.  You have continuous pain in your abdomen. Get help right away if:  Your contractions become stronger, more regular, and closer together.  You have fluid leaking or gushing from your vagina.  You pass blood-tinged mucus (bloody show).  You have bleeding from your vagina.  You have low back pain that you never had before.  You feel your baby's head pushing down and causing pelvic pressure.  Your baby is not moving inside you as much as it used to. Summary  Contractions that occur before labor are   called Braxton Hicks contractions, false labor, or practice contractions.  Braxton Hicks contractions are usually shorter, weaker, farther apart, and less regular than true labor contractions. True labor contractions usually become progressively stronger and regular, and they become more frequent.  Manage discomfort from Braxton Hicks contractions  by changing position, resting in a warm bath, drinking plenty of water, or practicing deep breathing. This information is not intended to replace advice given to you by your health care provider. Make sure you discuss any questions you have with your health care provider. Document Released: 06/11/2016 Document Revised: 01/08/2017 Document Reviewed: 06/11/2016 Elsevier Patient Education  2020 Elsevier Inc.  

## 2018-10-01 ENCOUNTER — Encounter (HOSPITAL_COMMUNITY): Payer: Self-pay | Admitting: *Deleted

## 2018-10-01 ENCOUNTER — Inpatient Hospital Stay (HOSPITAL_COMMUNITY): Payer: BC Managed Care – PPO | Admitting: Anesthesiology

## 2018-10-01 ENCOUNTER — Other Ambulatory Visit: Payer: Self-pay

## 2018-10-01 ENCOUNTER — Inpatient Hospital Stay (HOSPITAL_COMMUNITY)
Admission: AD | Admit: 2018-10-01 | Discharge: 2018-10-04 | DRG: 807 | Disposition: A | Discharge status: Home / Self Care | Payer: BC Managed Care – PPO | Attending: Obstetrics and Gynecology | Admitting: Obstetrics and Gynecology

## 2018-10-01 DIAGNOSIS — O321XX0 Maternal care for breech presentation, not applicable or unspecified: Secondary | ICD-10-CM | POA: Diagnosis present

## 2018-10-01 DIAGNOSIS — Z20828 Contact with and (suspected) exposure to other viral communicable diseases: Secondary | ICD-10-CM | POA: Diagnosis present

## 2018-10-01 DIAGNOSIS — Z3A40 40 weeks gestation of pregnancy: Secondary | ICD-10-CM | POA: Diagnosis not present

## 2018-10-01 DIAGNOSIS — Z3403 Encounter for supervision of normal first pregnancy, third trimester: Secondary | ICD-10-CM

## 2018-10-01 DIAGNOSIS — Z3A41 41 weeks gestation of pregnancy: Secondary | ICD-10-CM | POA: Diagnosis not present

## 2018-10-01 DIAGNOSIS — O480 Post-term pregnancy: Secondary | ICD-10-CM | POA: Diagnosis present

## 2018-10-01 LAB — CBC
HCT: 40.4 % (ref 36.0–46.0)
Hemoglobin: 13.9 g/dL (ref 12.0–15.0)
MCH: 33.8 pg (ref 26.0–34.0)
MCHC: 34.4 g/dL (ref 30.0–36.0)
MCV: 98.3 fL (ref 80.0–100.0)
Platelets: 231 10*3/uL (ref 150–400)
RBC: 4.11 MIL/uL (ref 3.87–5.11)
RDW: 12.8 % (ref 11.5–15.5)
WBC: 19.3 10*3/uL — ABNORMAL HIGH (ref 4.0–10.5)
nRBC: 0 % (ref 0.0–0.2)

## 2018-10-01 LAB — ABO/RH: ABO/RH(D): O POS

## 2018-10-01 LAB — TYPE AND SCREEN
ABO/RH(D): O POS
Antibody Screen: NEGATIVE

## 2018-10-01 LAB — RPR: RPR Ser Ql: NONREACTIVE

## 2018-10-01 LAB — SARS CORONAVIRUS 2 BY RT PCR (HOSPITAL ORDER, PERFORMED IN ~~LOC~~ HOSPITAL LAB): SARS Coronavirus 2: NEGATIVE

## 2018-10-01 MED ORDER — LACTATED RINGERS IV SOLN
500.0000 mL | Freq: Once | INTRAVENOUS | Status: AC
  Administered 2018-10-01: 500 mL via INTRAVENOUS

## 2018-10-01 MED ORDER — EPHEDRINE 5 MG/ML INJ: 10.0000 mg | INTRAVENOUS | Status: DC | PRN

## 2018-10-01 MED ORDER — OXYCODONE-ACETAMINOPHEN 5-325 MG PO TABS: 2.0000 | ORAL_TABLET | ORAL | Status: DC | PRN

## 2018-10-01 MED ORDER — PHENYLEPHRINE 40 MCG/ML (10ML) SYRINGE FOR IV PUSH (FOR BLOOD PRESSURE SUPPORT): 80.0000 ug | PREFILLED_SYRINGE | INTRAVENOUS | Status: DC | PRN

## 2018-10-01 MED ORDER — TERBUTALINE SULFATE 1 MG/ML IJ SOLN
0.2500 mg | Freq: Once | INTRAMUSCULAR | Status: AC
  Administered 2018-10-01: 0.25 mg via SUBCUTANEOUS

## 2018-10-01 MED ORDER — FENTANYL CITRATE (PF) 100 MCG/2ML IJ SOLN: 100.0000 ug | INTRAMUSCULAR | Status: DC | PRN

## 2018-10-01 MED ORDER — OXYTOCIN 40 UNITS IN NORMAL SALINE INFUSION - SIMPLE MED
1.0000 m[IU]/min | INTRAVENOUS | Status: DC
  Administered 2018-10-01: 2 m[IU]/min via INTRAVENOUS
  Filled 2018-10-01: qty 1000

## 2018-10-01 MED ORDER — SOD CITRATE-CITRIC ACID 500-334 MG/5ML PO SOLN: 30.0000 mL | ORAL | Status: DC | PRN

## 2018-10-01 MED ORDER — OXYCODONE-ACETAMINOPHEN 5-325 MG PO TABS: 1.0000 | ORAL_TABLET | ORAL | Status: DC | PRN

## 2018-10-01 MED ORDER — DIPHENHYDRAMINE HCL 50 MG/ML IJ SOLN: 12.5000 mg | INTRAMUSCULAR | Status: DC | PRN

## 2018-10-01 MED ORDER — OXYTOCIN 40 UNITS IN NORMAL SALINE INFUSION - SIMPLE MED: 2.5000 [IU]/h | INTRAVENOUS | Status: DC

## 2018-10-01 MED ORDER — FENTANYL CITRATE (PF) 100 MCG/2ML IJ SOLN
INTRAMUSCULAR | Status: AC
  Administered 2018-10-01: 100 ug
  Filled 2018-10-01: qty 2

## 2018-10-01 MED ORDER — OXYTOCIN 40 UNITS IN NORMAL SALINE INFUSION - SIMPLE MED: 1.0000 m[IU]/min | INTRAVENOUS | Status: DC

## 2018-10-01 MED ORDER — SODIUM CHLORIDE (PF) 0.9 % IJ SOLN
INTRAMUSCULAR | Status: DC | PRN
  Administered 2018-10-01: 12 mL/h via EPIDURAL

## 2018-10-01 MED ORDER — TERBUTALINE SULFATE 1 MG/ML IJ SOLN
INTRAMUSCULAR | Status: AC
  Filled 2018-10-01: qty 1

## 2018-10-01 MED ORDER — ONDANSETRON HCL 4 MG/2ML IJ SOLN
4.0000 mg | Freq: Four times a day (QID) | INTRAMUSCULAR | Status: DC | PRN
  Administered 2018-10-01: 4 mg via INTRAVENOUS
  Filled 2018-10-01: qty 2

## 2018-10-01 MED ORDER — LACTATED RINGERS IV SOLN
500.0000 mL | INTRAVENOUS | Status: DC | PRN
  Administered 2018-10-01: 500 mL via INTRAVENOUS

## 2018-10-01 MED ORDER — TERBUTALINE SULFATE 1 MG/ML IJ SOLN: 0.2500 mg | Freq: Once | INTRAMUSCULAR | Status: DC | PRN

## 2018-10-01 MED ORDER — FENTANYL-BUPIVACAINE-NACL 0.5-0.125-0.9 MG/250ML-% EP SOLN
12.0000 mL/h | EPIDURAL | Status: DC | PRN
  Administered 2018-10-02: 12 mL/h via EPIDURAL
  Filled 2018-10-01 (×2): qty 250

## 2018-10-01 MED ORDER — OXYTOCIN BOLUS FROM INFUSION
500.0000 mL | Freq: Once | INTRAVENOUS | Status: AC
  Administered 2018-10-02: 500 mL via INTRAVENOUS

## 2018-10-01 MED ORDER — LIDOCAINE HCL (PF) 1 % IJ SOLN
INTRAMUSCULAR | Status: DC | PRN
  Administered 2018-10-01: 10 mL via EPIDURAL

## 2018-10-01 MED ORDER — LACTATED RINGERS AMNIOINFUSION
INTRAVENOUS | Status: DC
  Administered 2018-10-01: 07:00:00 via INTRAUTERINE

## 2018-10-01 MED ORDER — LIDOCAINE HCL (PF) 1 % IJ SOLN: 30.0000 mL | INTRAMUSCULAR | Status: DC | PRN

## 2018-10-01 MED ORDER — LACTATED RINGERS IV SOLN
INTRAVENOUS | Status: DC
  Administered 2018-10-01 (×2): via INTRAVENOUS

## 2018-10-01 MED ORDER — ACETAMINOPHEN 325 MG PO TABS: 650.0000 mg | ORAL_TABLET | ORAL | Status: DC | PRN

## 2018-10-01 NOTE — Anesthesia Procedure Notes (Signed)
Epidural Patient location during procedure: OB Start time: 10/01/2018 4:01 AM End time: 10/01/2018 4:13 AM  Staffing Anesthesiologist: Lidia Collum, MD Performed: anesthesiologist   Preanesthetic Checklist Completed: patient identified, pre-op evaluation, timeout performed, IV checked, risks and benefits discussed and monitors and equipment checked  Epidural Patient position: sitting Prep: DuraPrep Patient monitoring: heart rate, continuous pulse ox and blood pressure Approach: midline Location: L3-L4 Injection technique: LOR air  Needle:  Needle type: Tuohy  Needle gauge: 17 G Needle length: 9 cm Needle insertion depth: 5 cm Catheter type: closed end flexible Catheter size: 19 Gauge Catheter at skin depth: 10 cm Test dose: negative  Assessment Events: blood not aspirated, injection not painful, no injection resistance, negative IV test and no paresthesia  Additional Notes Reason for block:procedure for pain

## 2018-10-01 NOTE — Progress Notes (Signed)
Christy Durham is a 28 y.o. G1P0 at [redacted]w[redacted]d by LMP admitted for rupture of membranes  Subjective: Pt comfortable with epidural, husband in room for support.  Objective: BP 116/74   Pulse 91   Temp 97.9 F (36.6 C) (Oral)   Resp 16   Ht 5\' 4"  (1.626 m)   Wt 74.8 kg   LMP 12/21/2017   SpO2 100%   BMI 28.32 kg/m  No intake/output data recorded. Total I/O In: -  Out: 875 [Urine:875]  FHT:  FHR: 135 bpm, variability: moderate,  accelerations:  Present,  decelerations:  Absent UC:   irregular, every 1-4 minutes SVE:   Dilation: 7 Effacement (%): 90 Station: 0 Exam by:: [employer]  Labs: Lab Results  Component Value Date   WBC 19.3 (H) 10/01/2018   HGB 13.9 10/01/2018   HCT 40.4 10/01/2018   MCV 98.3 10/01/2018   PLT 231 10/01/2018    Assessment / Plan: Augmentation of labor, progressing well  Labor: Pt with irregular contraction pattern and slow but steady labor progress.  Exam with vertex, but likely acynclytic position.  RN to continue to reposition with peanut ball, increase Pitocin per protocol and to maintain adequate contractions, and will recheck in 2-3 hours. Preeclampsia:  n/a Fetal Wellbeing:  Category I Pain Control:  Epidural I/D:  GBS neg Anticipated MOD:  NSVD  [name] 10/01/2018, 6:44 PM

## 2018-10-01 NOTE — H&P (Addendum)
OBSTETRIC ADMISSION HISTORY AND PHYSICAL  Christy Durham is a 28 y.o. female G1P0 with IUP at 7764w4d by LMP presenting for spontaneous labor. Presented to the MAU in the evening with frequent contractions and sent home.  Returned after her water broke around 1:30. She reports +FMs, no VB, no blurry vision, headaches or peripheral edema, and RUQ pain.  She plans on breast feeding. She request IUD for birth control. [facility]   Dating: By LMP  --->  Estimated Date of Delivery: 09/27/18  Sono:  @[redacted]w[redacted]d , CWD, normal anatomy, breech presentation, 522g, 53% EFW   Prenatal History/Complications: None  Past Medical History: No past medical history on file.  Past Surgical History: Past Surgical History:  Procedure Laterality Date  . TONSILLECTOMY      Obstetrical History: OB History    Gravida  1   Para      Term      Preterm      AB      Living        SAB      TAB      Ectopic      Multiple      Live Births              Social History: Social History   Socioeconomic History  . Marital status: Married    Spouse name: Not on file  . Number of children: Not on file  . Years of education: Not on file  . Highest education level: Not on file  Occupational History  . Occupation: Public relations account executivepricing coordinator    Employer: [employer]  Social Needs  . Financial resource strain: Not on file  . Food insecurity    Worry: Not on file    Inability: Not on file  . Transportation needs    Medical: Not on file    Non-medical: Not on file  Tobacco Use  . Smoking status: Never Smoker  . Smokeless tobacco: Never Used  Substance and Sexual Activity  . Alcohol use: Not Currently  . Drug use: Never  . Sexual activity: Yes    Partners: Male    Birth control/protection: None  Lifestyle  . Physical activity    Days per week: Not on file    Minutes per session: Not on file  . Stress: Not on file  Relationships  . Social Musicianconnections    Talks on phone: Not  on file    Gets together: Not on file    Attends religious service: Not on file    Active member of club or organization: Not on file    Attends meetings of clubs or organizations: Not on file    Relationship status: Not on file  Other Topics Concern  . Not on file  Social History Narrative  . Not on file    Family History: Family History  Problem Relation Age of Onset  . Cancer Mother 7150       Brease  . Heart defect Maternal Uncle     Allergies: No Known Allergies  Medications Prior to Admission  Medication Sig Dispense Refill Last Dose  . Prenatal Vit-Fe Fumarate-FA (PRENATAL COMPLETE PO) Take by mouth.        Review of Systems   All systems reviewed and negative except as stated in HPI  Last menstrual period 12/21/2017. General appearance: alert, cooperative and appears stated age Lungs: clear to auscultation bilaterally Heart: regular rate and rhythm Abdomen: soft, non-tender; bowel sounds normal  Fetal monitoringBaseline: 145 bpm, Variability: Good {> 6 bpm) and Accelerations: Reactive Uterine activityFrequency: Every 2-3 minutes     Prenatal labs: ABO, Rh: O/RH(D) POSITIVE/-- (01/22 1600) Antibody: NO ANTIBODIES DETECTED (01/22 1600) Rubella: 1.37 (01/22 1600) RPR: NON-REACTIVE (05/29 0842)  HBsAg: NON-REACTIVE (01/22 1600)  HIV: NON-REACTIVE (05/29 0842)  GBS:    1 hr Glucola 76/83/102 Genetic screening  Low risk Anatomy US normal  Prenatal Transfer Tool  Maternal Diabetes: No Genetic Screening: Normal Maternal Ultrasounds/Referrals: Normal Fetal Ultrasounds or other Referrals:  None Maternal Substance Abuse:  No Significant Maternal Medications:  None Significant Maternal Lab Results: Group B Strep negative  No results found for this or any previous visit (from the past 24 hour(s)).  Patient Active Problem List   Diagnosis Date Noted  . Post term pregnancy at [redacted] weeks gestation 10/01/2018  . Supervision of low-risk first pregnancy 03/02/2018     Assessment/Plan:  Christy Durham is a 28 y.o. G1P0 at [redacted]w[redacted]d here for SROM and active labor  #Labor: Progressing well without augmentation.   #Pain: Epidural once labs return. #FWB: Category I #ID:  GBS neg #MOF: breast #MOC:IUD #Circ:  Yes  Matilde Haymaker, MD  10/01/2018, 2:37 AM  CNM attestation:  I have seen and examined this patient; I agree with above documentation in the resident's note.   Christy Durham is a 28 y.o. G1P0 here for latent labor  PE: BP (!) 108/56   Pulse (!) 108   Temp 97.9 F (36.6 C) (Oral)   Resp 16   Ht 5\' 4"  (1.626 m)   Wt 74.8 kg   LMP 12/21/2017   SpO2 100%   BMI 28.32 kg/m   Resp: normal effort, no distress Abd: gravid  ROS, labs, PMH reviewed  Plan: Admit to Labor and Delivery Plan expectant management for now Anticipate vag del  Roy 10/01/2018, 10:26 AM

## 2018-10-01 NOTE — Progress Notes (Signed)
Labor Progress Note Christy Durham is a 28 y.o. G1P0 at [redacted]w[redacted]d presented for SROM  S: Tolerating contractions well. Epidural placed.  O:  BP (!) 105/49   Pulse 96   Temp 97.9 F (36.6 C) (Oral)   Resp 18   Ht 5\' 4"  (1.626 m)   Wt 74.8 kg   LMP 12/21/2017   SpO2 100%   BMI 28.32 kg/m  EFM: 140/moderate var/accels, w/ recurrent variable decels  CVE: Dilation: 5 Effacement (%): 80 Station: 0 Presentation: Vertex Exam by:: Dr Pilar Plate   A&P: 28 y.o. G1P0 [redacted]w[redacted]d here for SROM #Labor: Progressing well without augmentation. IUPC placed. Amnioinfusion started. Continue to monitor FMT. #Pain: Epidural placed #FWB: Category II #GBS negative   Matilde Haymaker, MD 7:07 AM

## 2018-10-01 NOTE — Progress Notes (Signed)
Labor Progress Note [name] is a 28 y.o. G1P0 at [redacted]w[redacted]d presented for rupture of membranes.  S: Can feel her contractions but is comfortable with the epidural in place.   O:  BP 122/67   Pulse (!) 102   Temp 98.8 F (37.1 C) (Oral)   Resp 18   Ht 5\' 4"  (1.626 m)   Wt 74.8 kg   LMP 12/21/2017   SpO2 100%   BMI 28.32 kg/m  EFM: 135/moderate var/accels, one variable in the past 2.5 hours.  CVE: Dilation: Lip/rim Effacement (%): 100 Cervical Position: Middle Station: Plus 1 Presentation: Vertex Exam by:: Lamonte Sakai, RNC   A&P: 28 y.o. G1P0 [redacted]w[redacted]d here with SOL.  #Labor: Progressing well. Now on Pit. Close to complete. #Pain: Epidural in place #FWB: category II, reassuring. #GBS negative #MOD: NSVD  Matilde Haymaker, MD 10:32 PM

## 2018-10-01 NOTE — Anesthesia Preprocedure Evaluation (Signed)

## 2018-10-01 NOTE — Progress Notes (Signed)
Christy Durham is a 28 y.o. G1P0 at [redacted]w[redacted]d by LMP admitted for rupture of membranes  Subjective:   Objective: BP 118/70   Pulse (!) 108   Temp 97.9 F (36.6 C) (Oral)   Resp 16   Ht 5\' 4"  (1.626 m)   Wt 74.8 kg   LMP 12/21/2017   SpO2 100%   BMI 28.32 kg/m  No intake/output data recorded. No intake/output data recorded.  FHT:  FHR: 150 bpm, variability: moderate,  accelerations:  Present,  decelerations:  Absent UC:   regular, every 1-3 minutes SVE:   Dilation: 6 Effacement (%): 90 Station: 0 Exam by:: Maureen Chatters Positive scalp stimulation during exam  Labs: Lab Results  Component Value Date   WBC 19.3 (H) 10/01/2018   HGB 13.9 10/01/2018   HCT 40.4 10/01/2018   MCV 98.3 10/01/2018   PLT 231 10/01/2018    Assessment / Plan: Arrest in active phase of labor  Labor: Pt with frequent contractions but with inadequate MVUs with low amplitude.  Start Pitocin at 2 milliunits/min, increase per protocol.   Preeclampsia:  n/a Fetal Wellbeing:  Category I Pain Control:  Epidural I/D:  GBS negative Anticipated MOD:  NSVD  Daemion Mcniel [place] 10/01/2018, 1:50 PM

## 2018-10-01 NOTE — MAU Note (Signed)
Covid swab obtained without difficulty and pt tol well. No symptoms 

## 2018-10-01 NOTE — MAU Note (Signed)
Water broke at 0130, clear fluid

## 2018-10-02 ENCOUNTER — Encounter (HOSPITAL_COMMUNITY): Payer: Self-pay | Admitting: *Deleted

## 2018-10-02 ENCOUNTER — Other Ambulatory Visit (HOSPITAL_COMMUNITY): Payer: BC Managed Care – PPO

## 2018-10-02 DIAGNOSIS — Z3A40 40 weeks gestation of pregnancy: Secondary | ICD-10-CM

## 2018-10-02 DIAGNOSIS — O480 Post-term pregnancy: Secondary | ICD-10-CM

## 2018-10-02 LAB — CBC
HCT: 35.6 % — ABNORMAL LOW (ref 36.0–46.0)
Hemoglobin: 11.9 g/dL — ABNORMAL LOW (ref 12.0–15.0)
MCH: 33.6 pg (ref 26.0–34.0)
MCHC: 33.4 g/dL (ref 30.0–36.0)
MCV: 100.6 fL — ABNORMAL HIGH (ref 80.0–100.0)
Platelets: 194 10*3/uL (ref 150–400)
RBC: 3.54 MIL/uL — ABNORMAL LOW (ref 3.87–5.11)
RDW: 13 % (ref 11.5–15.5)
WBC: 20 10*3/uL — ABNORMAL HIGH (ref 4.0–10.5)
nRBC: 0 % (ref 0.0–0.2)

## 2018-10-02 MED ORDER — ONDANSETRON HCL 4 MG/2ML IJ SOLN: 4.0000 mg | INTRAMUSCULAR | Status: DC | PRN

## 2018-10-02 MED ORDER — COCONUT OIL OIL
1.0000 "application " | TOPICAL_OIL | Status: DC | PRN
  Administered 2018-10-02: 1 via TOPICAL

## 2018-10-02 MED ORDER — WITCH HAZEL-GLYCERIN EX PADS: 1.0000 "application " | MEDICATED_PAD | CUTANEOUS | Status: DC | PRN

## 2018-10-02 MED ORDER — PRENATAL MULTIVITAMIN CH
1.0000 | ORAL_TABLET | Freq: Every day | ORAL | Status: DC
  Administered 2018-10-02 – 2018-10-04 (×3): 1 via ORAL
  Filled 2018-10-02 (×3): qty 1

## 2018-10-02 MED ORDER — DIBUCAINE (PERIANAL) 1 % EX OINT: 1.0000 "application " | TOPICAL_OINTMENT | CUTANEOUS | Status: DC | PRN

## 2018-10-02 MED ORDER — ZOLPIDEM TARTRATE 5 MG PO TABS: 5.0000 mg | ORAL_TABLET | Freq: Every evening | ORAL | Status: DC | PRN

## 2018-10-02 MED ORDER — IBUPROFEN 600 MG PO TABS
600.0000 mg | ORAL_TABLET | Freq: Four times a day (QID) | ORAL | Status: DC
  Administered 2018-10-02 – 2018-10-04 (×10): 600 mg via ORAL
  Filled 2018-10-02 (×10): qty 1

## 2018-10-02 MED ORDER — SIMETHICONE 80 MG PO CHEW: 80.0000 mg | CHEWABLE_TABLET | ORAL | Status: DC | PRN

## 2018-10-02 MED ORDER — TETANUS-DIPHTH-ACELL PERTUSSIS 5-2.5-18.5 LF-MCG/0.5 IM SUSP: 0.5000 mL | Freq: Once | INTRAMUSCULAR | Status: DC

## 2018-10-02 MED ORDER — SENNOSIDES-DOCUSATE SODIUM 8.6-50 MG PO TABS
2.0000 | ORAL_TABLET | ORAL | Status: DC
  Administered 2018-10-02 – 2018-10-03 (×2): 2 via ORAL
  Filled 2018-10-02 (×2): qty 2

## 2018-10-02 MED ORDER — DIPHENHYDRAMINE HCL 25 MG PO CAPS: 25.0000 mg | ORAL_CAPSULE | Freq: Four times a day (QID) | ORAL | Status: DC | PRN

## 2018-10-02 MED ORDER — ACETAMINOPHEN 325 MG PO TABS: 650.0000 mg | ORAL_TABLET | ORAL | Status: DC | PRN

## 2018-10-02 MED ORDER — ONDANSETRON HCL 4 MG PO TABS: 4.0000 mg | ORAL_TABLET | ORAL | Status: DC | PRN

## 2018-10-02 MED ORDER — BENZOCAINE-MENTHOL 20-0.5 % EX AERO
1.0000 "application " | INHALATION_SPRAY | CUTANEOUS | Status: DC | PRN
  Administered 2018-10-02: 1 via TOPICAL
  Filled 2018-10-02: qty 56

## 2018-10-02 NOTE — Anesthesia Postprocedure Evaluation (Signed)
Anesthesia Post Note  Patient: Christy Durham  Procedure(s) Performed: AN AD HOC LABOR EPIDURAL     Patient location during evaluation: Mother Baby Anesthesia Type: Epidural Level of consciousness: awake and alert, oriented and patient cooperative Pain management: pain level controlled Vital Signs Assessment: post-procedure vital signs reviewed and stable Respiratory status: spontaneous breathing Cardiovascular status: stable Postop Assessment: no headache, epidural receding, patient able to bend at knees and no signs of nausea or vomiting Anesthetic complications: no Comments: Pt. States she is walking.  Pain score 5 in perineum.  Pt states pain is manageable.        Last Vitals:  Vitals:   10/02/18 0530 10/02/18 0641  BP: 128/89 (!) 124/96  Pulse: (!) 116 97  Resp: 18 16  Temp: 36.9 C 37.4 C  SpO2:      Last Pain:  Vitals:   10/02/18 0641  TempSrc: Oral  PainSc: 0-No pain   Pain Goal:                   [facility]

## 2018-10-02 NOTE — Lactation Note (Signed)
This note was copied from a baby's chart. Lactation Consultation Note  Patient Name: Christy Durham [id number] Date: 10/02/2018 Reason for consult: Initial assessment;1st time breastfeeding;Primapara;Term  46 hours old FT female who is being exclusively BF by his mother, she's a P1. Mom reported (+) breast changes during the pregnancy. She's also familiar with hand expression, and able to get some drops when doing so. When [facility] assisted with hand expression noticed that her nipples are everted but short shafted, however her tissue is compressible. Mom already using a hand pump and coconut oil given early by [employer]; she has a Medela DEBP at home.  Offered assistance with latch and mom agreed to have baby STS, parents were trying to get baby to the breast when entering the room. Tried the football position first but baby wouldn't latch, mom's bra was on the way. Then tried cross cradle and baby latched on this time after doing some suck training, he was in his quiet alert state, would suck on and off during the 12 minutes feeding with a few audible swallows noted. Mom still nursing when exiting the room. Reviewed normal newborn behavior, cluster feeding and feeding cues.  Feeding plan:  1. Encouraged mom to feed baby STS 8-12 times/24 hours or sooner if feeding cues are present 2. Hand expression and spoon/finger feeding were also encouraged  BF brochure, BF resources and feeding diary were reviewed. Parents reported all questions and concerns were answered, they're both aware of [place] OP services and will call PRN.  Maternal Data Formula Feeding for Exclusion: No Has patient been taught Hand Expression?: Yes Does the patient have breastfeeding experience prior to this delivery?: No  Feeding Feeding Type: Breast Fed  LATCH Score Latch: Repeated attempts needed to sustain latch, nipple held in mouth throughout feeding, stimulation needed to elicit sucking reflex.  Audible Swallowing: A few with  stimulation  Type of Nipple: Everted at rest and after stimulation(short shafted)  Comfort (Breast/Nipple): Soft / non-tender  Hold (Positioning): Assistance needed to correctly position infant at breast and maintain latch.  LATCH Score: 7  Interventions Interventions: Breast feeding basics reviewed;Assisted with latch;Skin to skin;Breast massage;Hand express;Breast compression;Adjust position;Hand pump;Support pillows;Position options;Coconut oil  Lactation Tools Discussed/Used Tools: Pump;Coconut oil Breast pump type: Manual WIC Program: No Pump Review: Setup, frequency, and cleaning Initiated by:: RN Date initiated:: 10/02/18   Consult Status Consult Status: Follow-up Date: 10/03/18 Follow-up type: In-patient    Jocelyn Nold Francene Boyers 10/02/2018, 10:24 PM

## 2018-10-02 NOTE — Discharge Summary (Signed)
Postpartum Discharge Summary     Patient Name: Christy Durham DOB: 14-Jul-1990 MRN: [id number]  Date of admission: 10/01/2018 Delivering Provider: Matilde Haymaker   Date of discharge: 10/04/2018  Admitting diagnosis: 40 wks ctx and water broke Intrauterine pregnancy: [redacted]w[redacted]d     Secondary diagnosis:  Active Problems:   Post term pregnancy at [redacted] weeks gestation  Additional problems: None     Discharge diagnosis: Term Pregnancy Delivered                                                                                                Post partum procedures:None  Augmentation: Pitocin  Complications: None and KZL>93 hours  Hospital course:  Onset of Labor With Vaginal Delivery     28 y.o. yo G1P0 at [redacted]w[redacted]d was admitted in Latent Labor on 10/01/2018. Patient had an uncomplicated labor course as follows: Membrane Rupture Time/Date: 1:30 AM ,10/01/2018   Intrapartum Procedures: Episiotomy: None [1]                                         Lacerations:  None [1]  Patient had a delivery of a Viable infant. 10/02/2018  Information for the patient's newborn:  Roshanna, Cimino [[id number]]  Delivery Method: Vaginal, Spontaneous(Filed from Delivery Summary)     Pateint had an uncomplicated postpartum course.  She is ambulating, tolerating a regular diet, passing flatus, and urinating well. Patient is discharged home in stable condition on 10/04/18. She would like IUD inserted at post-partum visit.   Magnesium Sulfate recieved: No BMZ received: No  Physical exam  Vitals:   10/02/18 2153 10/03/18 0700 10/03/18 2210 10/04/18 0543  BP: 117/60 119/85 127/74 127/80  Pulse: 89 79 74 78  Resp: 18 20 18 20   Temp: (!) 97.3 F (36.3 C) 97.6 F (36.4 C) 98.4 F (36.9 C) 97.7 F (36.5 C)  TempSrc: Oral Oral Oral Oral  SpO2:  100% 100% 99%  Weight:      Height:       General: alert, cooperative and no distress Lochia: appropriate Uterine Fundus: firm Incision: N/A DVT Evaluation: No evidence of  DVT seen on physical exam. Labs: Lab Results  Component Value Date   WBC 20.0 (H) 10/02/2018   HGB 11.9 (L) 10/02/2018   HCT 35.6 (L) 10/02/2018   MCV 100.6 (H) 10/02/2018   PLT 194 10/02/2018   No flowsheet data found.  Discharge instruction: per After Visit Summary and "Baby and Me Booklet".  After visit meds:  Allergies as of 10/04/2018   No Known Allergies     Medication List    TAKE these medications   PRENATAL COMPLETE PO Take by mouth.       Diet: No evidence of DVT seen on physical exam.  Activity: Advance as tolerated. Pelvic rest for 6 weeks.   Outpatient follow up:4 weeks Follow up Appt: No future appointments. Follow up Visit:   Please schedule this patient for Postpartum visit in: 6 weeks with the following provider: Any provider  For C/S patients schedule nurse incision check in weeks 2 weeks: no Low risk pregnancy complicated by: None Delivery mode:  SVD Anticipated Birth Control[facility]  PP Procedures needed: None  Schedule Integrated BH visit: no      Newborn Data: Live born female  Birth Weight:   APGAR: 6,9   Newborn Delivery   Birth date/time: 10/02/2018 03:11:00 Delivery type: Vaginal, Spontaneous      Baby Feeding: Breast Disposition:home with mother

## 2018-10-03 ENCOUNTER — Encounter: Payer: BC Managed Care – PPO | Admitting: Obstetrics & Gynecology

## 2018-10-03 NOTE — Lactation Note (Signed)
This note was copied from a baby's chart. Lactation Consultation Note  Patient Name: Christy Durham [id number] Date: 10/03/2018 Reason for consult: Term;Primapara;1st time breastfeeding;Hyperbilirubinemia  P1 mother whose infant is now 47 hours old.  This is a term infant under double phototherapy.  It has been 2 hours since the last feeding.  I offered to awaken baby and attempt latching.  Mother agreeable.    Mother's breasts are soft and non tender and nipples are everted and intact.Marland KitchenMarland KitchenShe is familiar with hand expression and has 1 ml of EBM at bedside.  Parents have a curved tip syringe at bedside but have not been shown how to use it.  Demonstrated feeding using this method and baby fed well.    Mother prefers to use the cross cradle hold.  Assisted baby to latch to the right breast.  Initially, it took him a few minutes to latch, but, once latched he began actively sucking.  Reviewed breast feeding basics while observing him feed for 25 minutes.  Showed parents how they can place the bili blanket over baby once latched and sucking.  Removed goggles and observed eyes.  Phototherapy has already helped lighten yellow tones in skin.    Suggested mother begin pumping with the DEBP for breast stimulation and to help increase milk supply for baby.  Mother was very willing to do this.  Father will hold baby under phototherapy while mother pumps.  Pump parts, assembly, disassembly and cleaning reviewed.  Observed pumping for 5 minutes and mother was already obtaining EBM.  Parents will feed back any EBM mother obtains to baby.  Discussed basic jaundice concepts and increasing supplementation volumes as mother is able to pump more EBM.  She will continue to feed on cue or at least 8-12 times/24 hours.  She will awaken baby at three hours if he does not self awaken.  Encouraged hand expression before/after feedings to help increase supply.    Mother has a DEBP for home use.  She will call for latch  assistance as needed.  Father supportive.  RN updated.   Maternal Data Formula Feeding for Exclusion: No Has patient been taught Hand Expression?: Yes Does the patient have breastfeeding experience prior to this delivery?: No  Feeding Feeding Type: Breast Fed  LATCH Score Latch: Repeated attempts needed to sustain latch, nipple held in mouth throughout feeding, stimulation needed to elicit sucking reflex.  Audible Swallowing: A few with stimulation  Type of Nipple: Everted at rest and after stimulation  Comfort (Breast/Nipple): Soft / non-tender  Hold (Positioning): Assistance needed to correctly position infant at breast and maintain latch.  LATCH Score: 7  Interventions Interventions: Breast feeding basics reviewed;Assisted with latch;Skin to skin;Breast massage;Hand express;Breast compression;Adjust position;DEBP;Expressed milk;Position options;Support pillows  Lactation Tools Discussed/Used Tools: Pump Breast pump type: Double-Electric Breast Pump WIC Program: No Pump Review: Setup, frequency, and cleaning;Milk Storage Initiated by:: Isami Mehra Date initiated:: 10/03/18   Consult Status Consult Status: Follow-up Date: 10/04/18 Follow-up type: In-patient    [name] 10/03/2018, 1:55 PM

## 2018-10-03 NOTE — Progress Notes (Addendum)
Patient ID: Luvern Mischke, female   DOB: 1990/06/24, 28 y.o.   MRN: [id number]  POSTPARTUM PROGRESS NOTE  Post Partum Day 1  Subjective:  Donyell Carrell is a 28 y.o. G1P1001 s/p NSVD at [redacted]w[redacted]d.  She reports she is doing well. No acute events overnight. She denies any problems with ambulating, voiding or po intake. Denies nausea or vomiting.  Pain is well controlled.  Lochia is lightening up, no clots. She is using 4-5 pads per day.  Objective: Blood pressure 119/85, pulse 79, temperature 97.6 F (36.4 C), temperature source Oral, resp. rate 20, height 5\' 4"  (1.626 m), weight 74.8 kg, last menstrual period 12/21/2017, SpO2 100 %, unknown if currently breastfeeding.  Physical Exam:  General: alert, cooperative and no distress Chest: no respiratory distress Heart:regular rate, distal pulses intact Abdomen: soft, nontender,  Uterine Fundus: firm, appropriately tender DVT Evaluation: No calf swelling or tenderness Extremities: no peripheral edema Skin: warm, dry  Recent Labs    10/01/18 0219 10/02/18 0709  HGB 13.9 11.9*  HCT 40.4 35.6*    Assessment/Plan: Aneisa Karren is a 29 y.o. G1P1001 s/p NSVD at [redacted]w[redacted]d   PPD#1 - Doing well  Routine postpartum care Wants circumcision for baby boy Contraception: IUD Feeding: breast/bottle Dispo: Plan for discharge 10/04/2018.   LOS: 2 days   Belenda Cruise, Medical Student 10/03/2018, 7:07 AM   I saw and evaluated the patient. I agree with the findings and the plan of care as documented in the resident's note. Planning on circumcision today but baby requires phototherapy. Will plan to circumcise prior to DC tomorrow.   Barrington Ellison, MD [facility] Family Medicine Fellow, [facility] for [employer], [place]

## 2018-10-04 ENCOUNTER — Inpatient Hospital Stay (HOSPITAL_COMMUNITY): Payer: BC Managed Care – PPO

## 2018-10-04 ENCOUNTER — Inpatient Hospital Stay (HOSPITAL_COMMUNITY): Admission: AD | Admit: 2018-10-04 | Payer: BC Managed Care – PPO | Source: Home / Self Care

## 2018-10-04 NOTE — Lactation Note (Signed)
This note was copied from a baby's chart. Lactation Consultation Note  Patient Name: Christy Durham Date: 10/04/2018 Reason for consult: Follow-up assessment;Term;Hyperbilirubinemia;Primapara;1st time breastfeeding  P1 mother whose infant is now 69 hours old.  Baby remains under phototherapy this a.m., however, bilirubin level has decreased.  Mother continues to breast feed and supplement after with EBM.  She informed me that baby is latching and feeding well.  She is having no difficulty with breast feeding.  Her breasts are soft and non tender and nipples are everted and intact.  She continues to post pump and feeds back any EBM she obtains to baby.  Syringe feeding is the method being used to feed back EBM.  Engorgement prevention/treatment discussed.  Mother has a manual pump and a DEBP for home use.  She has our OP phone number for questions after discharge.  Parents have worked well together to feed and supplement baby.  They are looking forward to discharge later today.   Maternal Data    Feeding    LATCH Score                   Interventions    Lactation Tools Discussed/Used     Consult Status Consult Status: Complete Date: 10/04/18 Follow-up type: Call as needed    Christy Durham 10/04/2018, 9:08 AM

## 2018-10-14 ENCOUNTER — Telehealth: Payer: Self-pay | Admitting: *Deleted

## 2018-10-14 MED ORDER — DICLOXACILLIN SODIUM 250 MG PO CAPS: ORAL_CAPSULE | ORAL | 0 refills | Status: DC

## 2018-10-14 NOTE — Telephone Encounter (Signed)
Pt called stating that she thinks she has mastitis in her left breast.  She has red streaks and has been waking up with night sweats.  She is unsure whether she has actually been running a fever.  She has put heat on breast and taking Tylenol without any relief.  Dicloxicillin sent to her pharmacy and advised patient that if not improved in a couple of days she would need to be seen.

## 2018-10-18 ENCOUNTER — Telehealth: Payer: Self-pay | Admitting: *Deleted

## 2018-10-18 NOTE — Telephone Encounter (Signed)
Pt called stating that she has been on Dicloxicillin since Friday and is still having redness area on both breast.  They are painful.  She is continuing to feed.  No  Provider in [place] office on Wed but appt given in [place] location with Dr Nehemiah Settle.

## 2018-10-19 ENCOUNTER — Encounter: Payer: Self-pay | Admitting: Family Medicine

## 2018-10-19 ENCOUNTER — Ambulatory Visit (INDEPENDENT_AMBULATORY_CARE_PROVIDER_SITE_OTHER): Payer: BC Managed Care – PPO | Admitting: Family Medicine

## 2018-10-19 ENCOUNTER — Other Ambulatory Visit: Payer: Self-pay

## 2018-10-19 VITALS — BP 108/68 | HR 68 | Ht 64.0 in | Wt 149.0 lb

## 2018-10-19 DIAGNOSIS — N61 Mastitis without abscess: Secondary | ICD-10-CM

## 2018-10-19 MED ORDER — FLUCONAZOLE 150 MG PO TABS: 150.0000 mg | ORAL_TABLET | ORAL | 1 refills | Status: AC

## 2018-10-19 MED ORDER — CLINDAMYCIN HCL 150 MG PO CAPS: 450.0000 mg | ORAL_CAPSULE | Freq: Three times a day (TID) | ORAL | 0 refills | Status: AC

## 2018-10-19 NOTE — Progress Notes (Signed)
   Subjective:    Patient ID: Christy Durham, female    DOB: [birth year], 28 y.o.   MRN: [id number]  HPI Patient is 2.5 weeks postpartum from vaginal delivery. Is breastfeeding. On Wednesday of last week, began to have night sweats, redness to right outer breast with red streaks and pain. Was prescribed dicloxicillin on Friday. Redness to right breast started to resolved, but developed redness to left medial breast and right medial breast. Left medial breast improving.    Review of Systems     Objective:   Physical Exam Constitutional:      Appearance: Normal appearance.  Cardiovascular:     Rate and Rhythm: Normal rate.  Pulmonary:     Effort: Pulmonary effort is normal.  Chest:    Skin:    General: Skin is warm.  Neurological:     Mental Status: She is alert.        Assessment & Plan:  1. Mastitis Change dicloxicillin to clindamycin 450mg  tid with meals. Encouraged pt to eat cup of yogurt a day to help with GI issues. Diflucan also prescribed as preventative to yeast vaginitis. Also recommended cabbage leaf compress and making sure to empty breast. F/u if not improving over next couple of days.

## 2018-11-05 DIAGNOSIS — N61 Mastitis without abscess: Secondary | ICD-10-CM | POA: Diagnosis not present

## 2018-11-14 ENCOUNTER — Encounter: Payer: Self-pay | Admitting: Obstetrics & Gynecology

## 2018-11-14 ENCOUNTER — Ambulatory Visit (INDEPENDENT_AMBULATORY_CARE_PROVIDER_SITE_OTHER): Payer: BC Managed Care – PPO | Admitting: Obstetrics & Gynecology

## 2018-11-14 ENCOUNTER — Other Ambulatory Visit: Payer: Self-pay

## 2018-11-14 VITALS — BP 116/66 | HR 82 | Resp 16 | Ht 64.0 in | Wt 142.0 lb

## 2018-11-14 DIAGNOSIS — Z30430 Encounter for insertion of intrauterine contraceptive device: Secondary | ICD-10-CM | POA: Diagnosis not present

## 2018-11-14 DIAGNOSIS — Z3202 Encounter for pregnancy test, result negative: Secondary | ICD-10-CM | POA: Diagnosis not present

## 2018-11-14 DIAGNOSIS — Z23 Encounter for immunization: Secondary | ICD-10-CM | POA: Diagnosis not present

## 2018-11-14 DIAGNOSIS — Z975 Presence of (intrauterine) contraceptive device: Secondary | ICD-10-CM

## 2018-11-14 LAB — POCT URINE PREGNANCY: Preg Test, Ur: NEGATIVE

## 2018-11-14 MED ORDER — LEVONORGESTREL 20 MCG/24HR IU IUD
INTRAUTERINE_SYSTEM | Freq: Once | INTRAUTERINE | Status: AC
  Administered 2018-11-14: 12:00:00 via INTRAUTERINE

## 2018-11-14 NOTE — Progress Notes (Signed)
Post Partum Exam  Christy Durham is a 28 y.o. G22P1001 female who presents for a postpartum visit. She is 6 weeks postpartum following a spontaneous vaginal delivery. I have fully reviewed the prenatal and intrapartum course. The delivery was at [redacted]w[redacted]d gestational weeks.  Anesthesia: epidural. Postpartum course has been unremarkable except for episode of masititis. Baby's course has been unremarkable. Baby is feeding by breast. Bleeding no bleeding. Bowel function is normal. Bladder function is normal. Patient is not sexually active. Contraception method is unsure. Postpartum depression screening:neg  The following portions of the patient's history were reviewed and updated as appropriate: allergies, current medications, past family history, past medical history, past social history, past surgical history and problem list. Last pap smear done 09/2017 and was Normal  Review of Systems Pertinent items noted in HPI and remainder of comprehensive ROS otherwise negative.    Objective:  Blood pressure 116/66, pulse 82, resp. rate 16, height 5\' 4"  (1.626 m), weight 142 lb (64.4 kg), last menstrual period 12/21/2017, currently breastfeeding.  General:  alert, cooperative and no distress   Breasts:  inspection negative, no nipple discharge or bleeding, no masses or nodularity palpable  Lungs: clear to auscultation bilaterally  Heart:  regular rate and rhythm  Abdomen: soft, non-tender; bowel sounds normal; no masses,  no organomegaly   Vulva:  normal  Vagina: normal vagina  Cervix:  no cervical motion tenderness  Corpus: normal size, contour, position, consistency, mobility, non-tender  Adnexa:  no mass, fullness, tenderness  Rectal Exam: Not performed.        Assessment:    normal postpartum exam. Pap smear not done at today's visit.   Plan:   1. Contraception: IUD 2. Pt requests Mirena over Liletta 3. Follow up in: 6 weeks or as needed.   IUD Procedure Note Patient identified, informed  consent performed.  Discussed risks of irregular bleeding, cramping, infection, malpositioning or misplacement of the IUD outside the uterus which may require further procedures. Time out was performed.  Urine pregnancy test negative.  Speculum placed in the vagina.  Cervix visualized.  Cleaned with Betadine x 2.  Grasped anteriorly with a single tooth tenaculum.  Uterus sounded to 7 cm.  Mirena IUD placed per manufacturer's recommendations.  Strings trimmed to 3 cm. Tenaculum was removed, good hemostasis noted.  Patient tolerated procedure well.   Patient was given post-procedure instructions and the Mirena care card with expiration date.  Patient was also asked to check IUD strings periodically and follow up in 4-6 weeks for IUD check.

## 2018-12-26 ENCOUNTER — Ambulatory Visit: Payer: BC Managed Care – PPO | Admitting: Obstetrics and Gynecology

## 2019-01-03 ENCOUNTER — Encounter: Payer: Self-pay | Admitting: Certified Nurse Midwife

## 2019-01-03 ENCOUNTER — Other Ambulatory Visit: Payer: Self-pay

## 2019-01-03 ENCOUNTER — Ambulatory Visit (INDEPENDENT_AMBULATORY_CARE_PROVIDER_SITE_OTHER): Payer: BC Managed Care – PPO | Admitting: Certified Nurse Midwife

## 2019-01-03 VITALS — BP 113/61 | HR 72 | Resp 16 | Ht 64.0 in | Wt 140.0 lb

## 2019-01-03 DIAGNOSIS — Z30431 Encounter for routine checking of intrauterine contraceptive device: Secondary | ICD-10-CM

## 2019-01-03 DIAGNOSIS — Z975 Presence of (intrauterine) contraceptive device: Secondary | ICD-10-CM

## 2019-01-03 NOTE — Patient Instructions (Signed)
Levonorgestrel intrauterine device (IUD) What is this medicine? LEVONORGESTREL IUD (LEE voe nor jes trel) is a contraceptive (birth control) device. The device is placed inside the uterus by a healthcare professional. It is used to prevent pregnancy. This device can also be used to treat heavy bleeding that occurs during your period. This medicine may be used for other purposes; ask your health care provider or pharmacist if you have questions. COMMON BRAND NAME(S): Kyleena, LILETTA, Mirena, Skyla What should I tell my health care provider before I take this medicine? They need to know if you have any of these conditions:  abnormal Pap smear  cancer of the breast, uterus, or cervix  diabetes  endometritis  genital or pelvic infection now or in the past  have more than one sexual partner or your partner has more than one partner  heart disease  history of an ectopic or tubal pregnancy  immune system problems  IUD in place  liver disease or tumor  problems with blood clots or take blood-thinners  seizures  use intravenous drugs  uterus of unusual shape  vaginal bleeding that has not been explained  an unusual or allergic reaction to levonorgestrel, other hormones, silicone, or polyethylene, medicines, foods, dyes, or preservatives  pregnant or trying to get pregnant  breast-feeding How should I use this medicine? This device is placed inside the uterus by a health care professional. Talk to your pediatrician regarding the use of this medicine in children. Special care may be needed. Overdosage: If you think you have taken too much of this medicine contact a poison control center or emergency room at once. NOTE: This medicine is only for you. Do not share this medicine with others. What if I miss a dose? This does not apply. Depending on the brand of device you have inserted, the device will need to be replaced every 3 to 6 years if you wish to continue using this type  of birth control. What may interact with this medicine? Do not take this medicine with any of the following medications:  amprenavir  bosentan  fosamprenavir This medicine may also interact with the following medications:  aprepitant  armodafinil  barbiturate medicines for inducing sleep or treating seizures  bexarotene  boceprevir  griseofulvin  medicines to treat seizures like carbamazepine, ethotoin, felbamate, oxcarbazepine, phenytoin, topiramate  modafinil  pioglitazone  rifabutin  rifampin  rifapentine  some medicines to treat HIV infection like atazanavir, efavirenz, indinavir, lopinavir, nelfinavir, tipranavir, ritonavir  St. John's wort  warfarin This list may not describe all possible interactions. Give your health care provider a list of all the medicines, herbs, non-prescription drugs, or dietary supplements you use. Also tell them if you smoke, drink alcohol, or use illegal drugs. Some items may interact with your medicine. What should I watch for while using this medicine? Visit your doctor or health care professional for regular check ups. See your doctor if you or your partner has sexual contact with others, becomes HIV positive, or gets a sexual transmitted disease. This product does not protect you against HIV infection (AIDS) or other sexually transmitted diseases. You can check the placement of the IUD yourself by reaching up to the top of your vagina with clean fingers to feel the threads. Do not pull on the threads. It is a good habit to check placement after each menstrual period. Call your doctor right away if you feel more of the IUD than just the threads or if you cannot feel the threads at   all. The IUD may come out by itself. You may become pregnant if the device comes out. If you notice that the IUD has come out use a backup birth control method like condoms and call your health care provider. Using tampons will not change the position of the  IUD and are okay to use during your period. This IUD can be safely scanned with magnetic resonance imaging (MRI) only under specific conditions. Before you have an MRI, tell your healthcare provider that you have an IUD in place, and which type of IUD you have in place. What side effects may I notice from receiving this medicine? Side effects that you should report to your doctor or health care professional as soon as possible:  allergic reactions like skin rash, itching or hives, swelling of the face, lips, or tongue  fever, flu-like symptoms  genital sores  high blood pressure  no menstrual period for 6 weeks during use  pain, swelling, warmth in the leg  pelvic pain or tenderness  severe or sudden headache  signs of pregnancy  stomach cramping  sudden shortness of breath  trouble with balance, talking, or walking  unusual vaginal bleeding, discharge  yellowing of the eyes or skin Side effects that usually do not require medical attention (report to your doctor or health care professional if they continue or are bothersome):  acne  breast pain  change in sex drive or performance  changes in weight  cramping, dizziness, or faintness while the device is being inserted  headache  irregular menstrual bleeding within first 3 to 6 months of use  nausea This list may not describe all possible side effects. Call your doctor for medical advice about side effects. You may report side effects to FDA at 1-800-FDA-1088. Where should I keep my medicine? This does not apply. NOTE: This sheet is a summary. It may not cover all possible information. If you have questions about this medicine, talk to your doctor, pharmacist, or health care provider.  2020 Elsevier/Gold Standard (2017-12-07 13:22:01)  

## 2019-01-03 NOTE — Progress Notes (Signed)
GYNECOLOGY OFFICE VISIT NOTE  History:  28 y.o. G1P1001 here today for IUD string check. Mirena IUD was placed on 11/14/2018. She denies any abnormal vaginal discharge, pelvic pain or other concerns. Having occasional spotting. No dyspareunia.   History reviewed. No pertinent past medical history.  Past Surgical History:  Procedure Laterality Date  . TONSILLECTOMY      The following portions of the patient's history were reviewed and updated as appropriate: allergies, current medications, past family history, past medical history, past social history, past surgical history and problem list.   Review of Systems:  Pertinent items noted in HPI and remainder of comprehensive ROS otherwise negative.  Objective:  Physical Exam BP 113/61   Pulse 72   Resp 16   Ht 5\' 4"  (1.626 m)   Wt 140 lb (63.5 kg)   Breastfeeding No   BMI 24.03 kg/m  CONSTITUTIONAL: Well-developed, well-nourished female in no acute distress.  HENT:  Normocephalic, atraumatic.  SKIN: Skin is warm and dry. No rash noted. Not diaphoretic. No erythema. No pallor. NEUROLOGIC: Alert and oriented to person, place, and time.  PSYCHIATRIC: Normal mood and affect. Normal behavior. Normal judgment and thought content. CARDIOVASCULAR: Normal heart rate noted RESPIRATORY: Effort and rate normal ABDOMEN: Soft, no distention noted.   PELVIC: Normal appearing external genitalia; normal appearing vaginal mucosa and cervix. IUD string seen. No abnormal discharge noted.    Assessment & Plan:  1. IUD (intrauterine device) in place - IUD in place  - Patient has no concerns or complaints  - Encouraged to check strings monthly or return to office if she starts having abdominal pain or worsening bleeding- patient verbalizes understanding   Please refer to After Visit Summary for other counseling recommendations.   Return in about 1 year (around 01/03/2020) for [place].   Lajean Manes, CNM 01/03/2019 3:16 PM

## 2020-03-21 DIAGNOSIS — H6501 Acute serous otitis media, right ear: Secondary | ICD-10-CM | POA: Diagnosis not present

## 2020-06-07 DIAGNOSIS — Z13220 Encounter for screening for lipoid disorders: Secondary | ICD-10-CM | POA: Diagnosis not present

## 2020-06-07 DIAGNOSIS — Z0000 Encounter for general adult medical examination without abnormal findings: Secondary | ICD-10-CM | POA: Diagnosis not present

## 2020-06-07 DIAGNOSIS — F411 Generalized anxiety disorder: Secondary | ICD-10-CM | POA: Diagnosis not present

## 2020-06-07 DIAGNOSIS — Z1329 Encounter for screening for other suspected endocrine disorder: Secondary | ICD-10-CM | POA: Diagnosis not present

## 2020-07-05 DIAGNOSIS — F411 Generalized anxiety disorder: Secondary | ICD-10-CM | POA: Diagnosis not present

## 2020-07-30 DIAGNOSIS — R0981 Nasal congestion: Secondary | ICD-10-CM | POA: Diagnosis not present

## 2020-07-30 DIAGNOSIS — J029 Acute pharyngitis, unspecified: Secondary | ICD-10-CM | POA: Diagnosis not present

## 2020-08-09 DIAGNOSIS — H6981 Other specified disorders of Eustachian tube, right ear: Secondary | ICD-10-CM | POA: Diagnosis not present

## 2020-08-09 DIAGNOSIS — J301 Allergic rhinitis due to pollen: Secondary | ICD-10-CM | POA: Diagnosis not present

## 2020-08-22 IMAGING — US US MFM OB COMP + 14 WK
1 series · 13 of 28 positions shown · non-contrast
Comparison: none

[Series 1: us mfm ob comp + 14 wk · 13 of 90 slices shown]
[im 4/90]
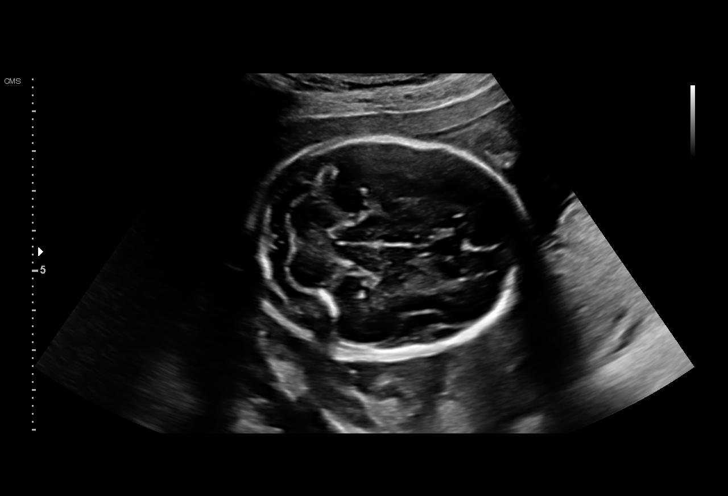
[im 10/90]
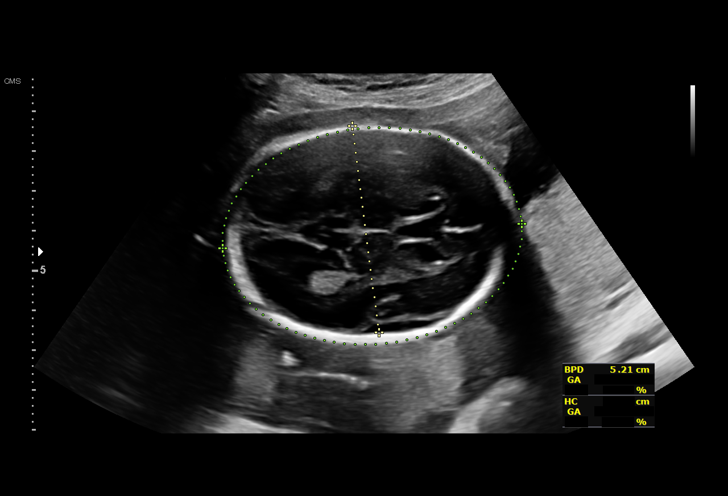
[im 17/90]
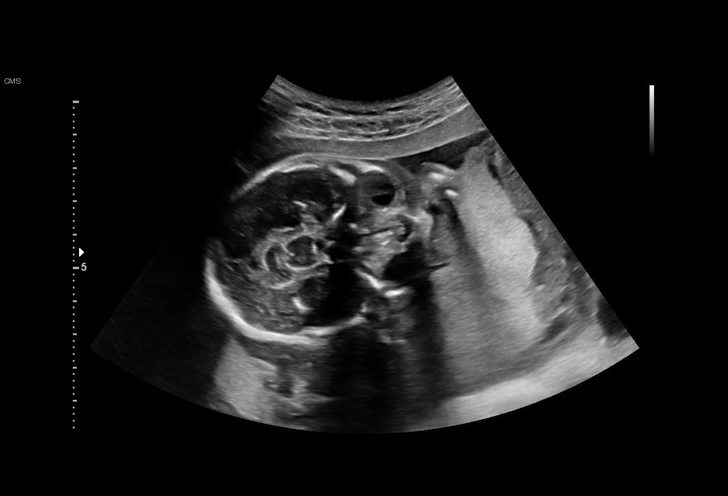
[im 24/90]
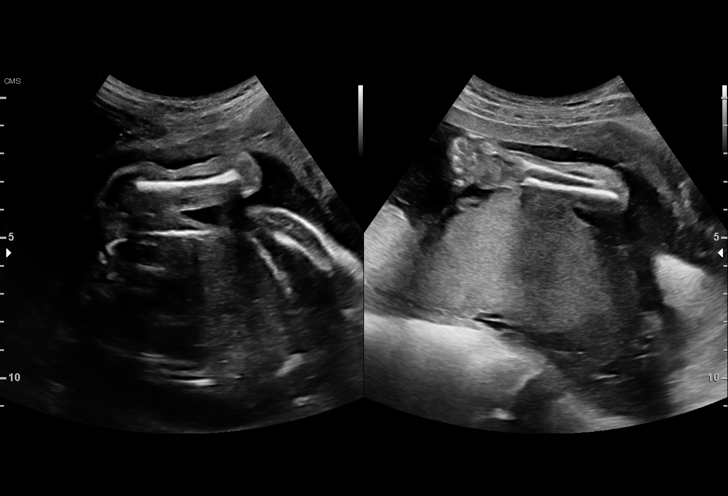
[im 30/90]
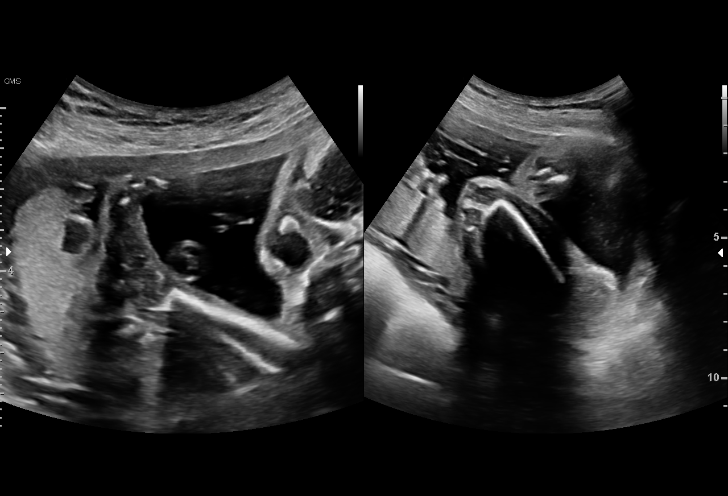
[im 37/90]
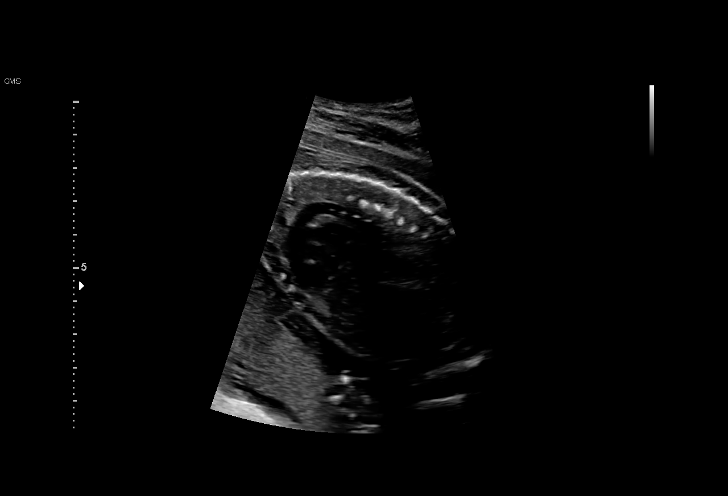
[im 47/90]
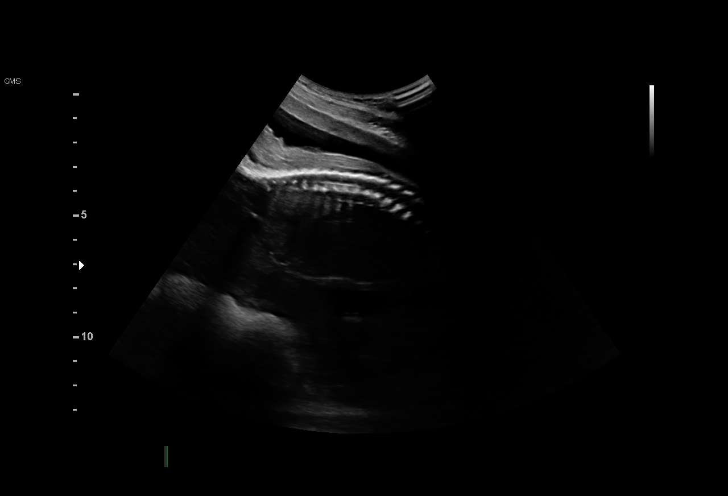
[im 53/90]
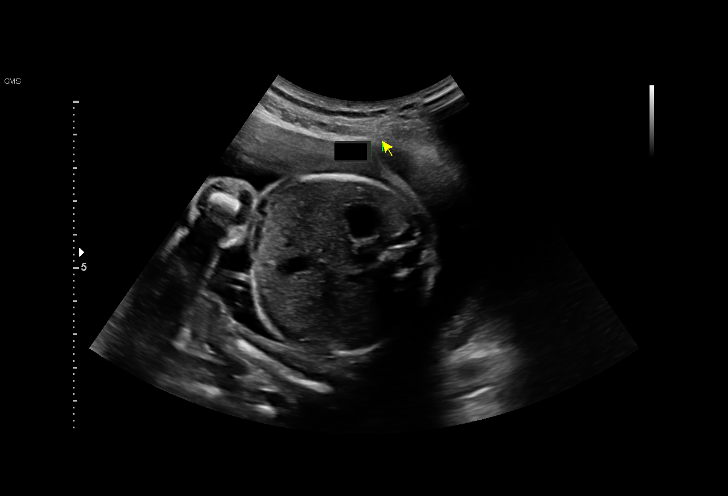
[im 60/90]
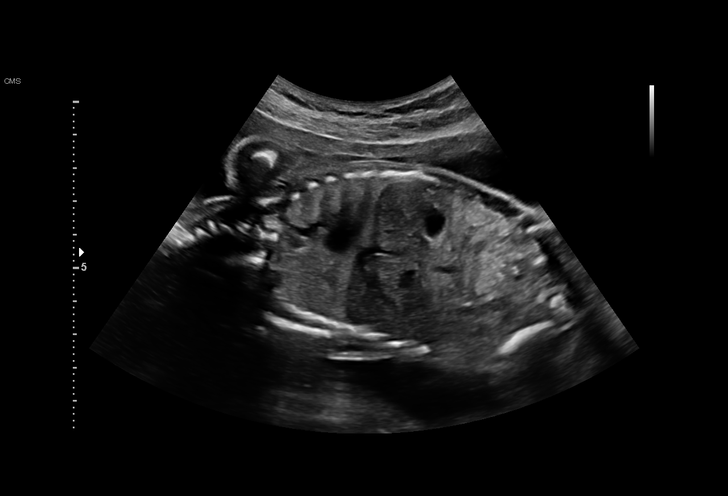
[im 66/90]
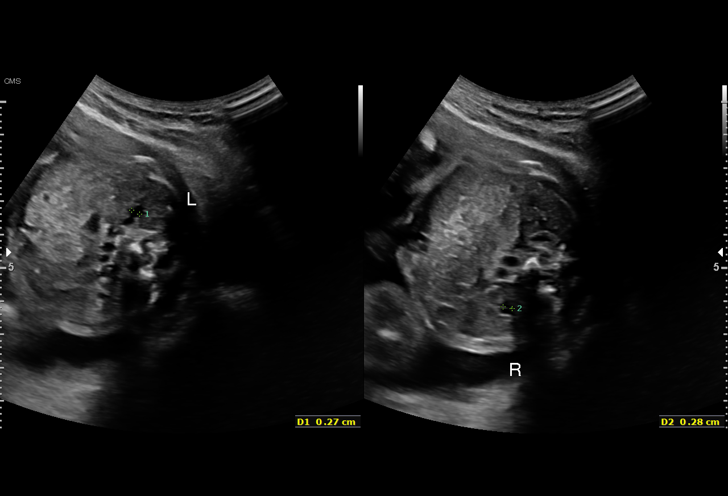
[im 73/90]
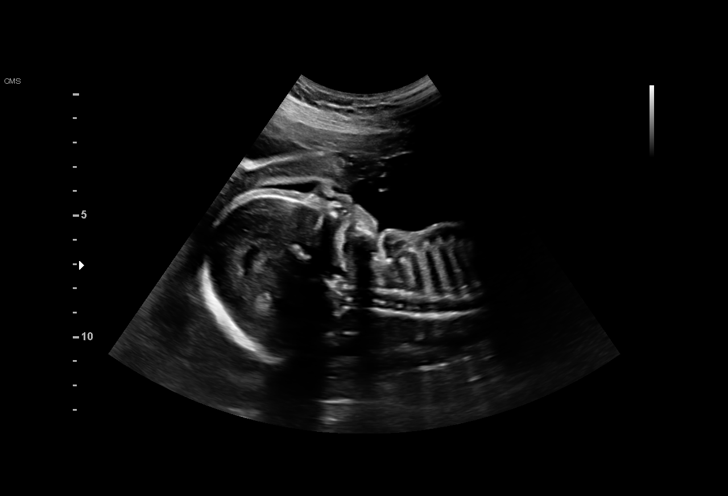
[im 80/90]
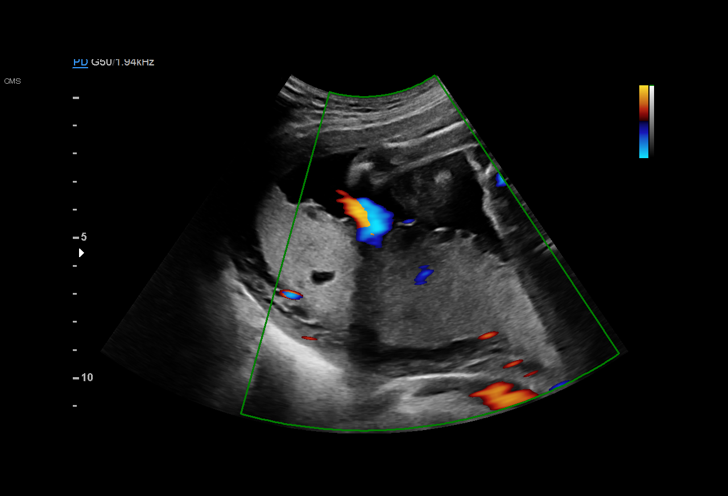
[im 86/90]
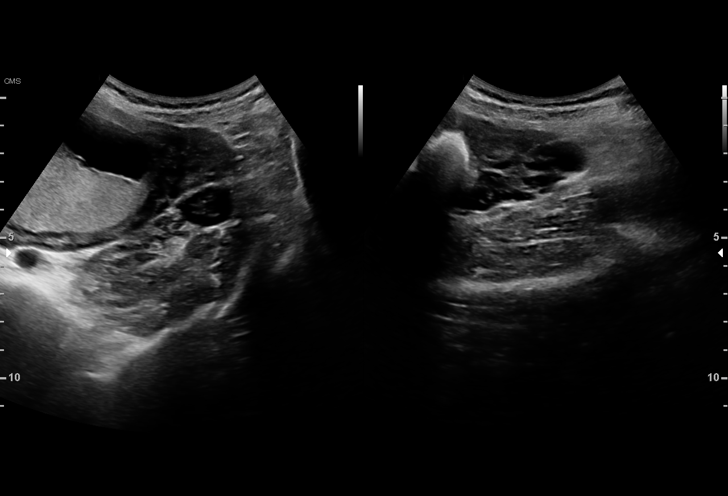

[13 of 28 positions shown; findings below may reference images not displayed]

1  US MFM OB COMP + 14 WK               76805.01     ZAMIRI BOUMEDIENE
 ----------------------------------------------------------------------

 ----------------------------------------------------------------------
Indications

  Encounter for antenatal screening for
  malformations
  22 weeks gestation of pregnancy
 ----------------------------------------------------------------------
Fetal Evaluation

 Num Of Fetuses:          1
 Fetal Heart Rate(bpm):   150
 Cardiac Activity:        Observed
 Presentation:            Breech
 Placenta:                Posterior
 P. Cord Insertion:       Visualized

 Amniotic Fluid
 AFI FV:      Within normal limits

                             Largest Pocket(cm)

Biometry

 BPD:      52.8  mm     G. Age:  22w 0d         31  %    CI:          66.8  %    70 - 86
                                                         FL/HC:       17.6  %    18.4 -
 HC:       207   mm     G. Age:  22w 6d         53  %    HC/AC:       1.10       1.06 -
 AC:      187.6  mm     G. Age:  23w 4d         76  %    FL/BPD:      68.9  %    71 - 87
 FL:       36.4  mm     G. Age:  21w 4d         16  %    FL/AC:       19.4  %    20 - 24
 HUM:      36.2  mm     G. Age:  22w 5d         49  %
 CER:      25.5  mm     G. Age:  23w 4d         66  %
 NFT:       3.7  mm

 CM:        5.4  mm
 Est. FW:     522   gm     1 lb 2 oz     53  %
OB History

 Gravidity:    1         Term:   0        Prem:   0        SAB:   0
 TOP:          0       Ectopic:  0        Living: 0
Gestational Age

 LMP:           22w 3d        Date:  12/21/17                 EDD:   09/27/18
 U/S Today:     22w 4d                                        EDD:   09/26/18
 Best:          22w 3d     Det. By:  LMP  (12/21/17)          EDD:   09/27/18
Anatomy

 Cranium:               Appears normal         LVOT:                   Appears normal
 Cavum:                 Appears normal         Aortic Arch:            Appears normal
 Ventricles:            Appears normal         Ductal Arch:            Appears normal
 Choroid Plexus:        Appears normal         Diaphragm:              Appears normal
 Cerebellum:            Appears normal         Stomach:                Appears normal, left
                                                                       sided
 Posterior Fossa:       Appears normal         Abdomen:                Appears normal
 Nuchal Fold:           Not applicable (>20    Abdominal Wall:         Appears nml (cord
                        wks GA)                                        insert, abd wall)
 Face:                  Appears normal         Cord Vessels:           Appears normal (3
                        (orbits and profile)                           vessel cord)
 Lips:                  Appears normal         Kidneys:                Appear normal
 Palate:                Appears normal         Bladder:                Appears normal
 Thoracic:              Appears normal         Spine:                  Appears normal
 Heart:                 Appears normal         Upper Extremities:      Appears normal
                        (4CH, axis, and
                        situs)
 RVOT:                  Appears normal         Lower Extremities:      Appears normal
Cervix Uterus Adnexa

 Cervix
 Length:              4  cm.
 Normal appearance by transabdominal scan.

 Uterus
 No abnormality visualized.

 Left Ovary
 Within normal limits.

 Right Ovary
 Within normal limits.

 Cul De Sac
 No free fluid seen.

 Adnexa
 No abnormality visualized.
Impression

 Normal interval growth.  No ultrasonic evidence of structural
 fetal anomalies.
Recommendations

 Follow up as clinically indicated.

## 2020-10-21 ENCOUNTER — Other Ambulatory Visit (HOSPITAL_COMMUNITY)
Admission: RE | Admit: 2020-10-21 | Discharge: 2020-10-21 | Disposition: A | Discharge status: Home / Self Care | Payer: BC Managed Care – PPO | Source: Ambulatory Visit | Attending: Obstetrics and Gynecology | Admitting: Obstetrics and Gynecology

## 2020-10-21 ENCOUNTER — Encounter: Payer: Self-pay | Admitting: Obstetrics and Gynecology

## 2020-10-21 ENCOUNTER — Ambulatory Visit (INDEPENDENT_AMBULATORY_CARE_PROVIDER_SITE_OTHER): Payer: BC Managed Care – PPO | Admitting: Obstetrics and Gynecology

## 2020-10-21 ENCOUNTER — Other Ambulatory Visit: Payer: Self-pay

## 2020-10-21 VITALS — BP 102/58 | HR 60 | Resp 16 | Ht 64.0 in | Wt 130.0 lb

## 2020-10-21 DIAGNOSIS — Z01419 Encounter for gynecological examination (general) (routine) without abnormal findings: Secondary | ICD-10-CM | POA: Diagnosis not present

## 2020-10-21 DIAGNOSIS — Z30432 Encounter for removal of intrauterine contraceptive device: Secondary | ICD-10-CM | POA: Diagnosis not present

## 2020-10-21 NOTE — Progress Notes (Addendum)
GYNECOLOGY ANNUAL PREVENTATIVE CARE ENCOUNTER NOTE  History:     Christy Durham is a 30 y.o. G48P1001 female here for a routine annual gynecologic exam.  Current complaints: none - desires IUD removal for plan to TTC later this year. Declines other birth control - will do NFP or condoms.   Denies abnormal vaginal bleeding, discharge, pelvic pain, problems with intercourse or other gynecologic concerns.      Gynecologic History No LMP recorded. (Menstrual status: IUD). Contraception: coitus interruptus Last Pap: 09/2017. Result was normal    Obstetric History OB History  Gravida Para Term Preterm AB Living  1 1 1     1   SAB IAB Ectopic Multiple Live Births        0 1    # Outcome Date GA Lbr Len/2nd Weight Sex Delivery Anes PTL Lv  1 Term 10/02/18 [redacted]w[redacted]d 24:25 / 01:16 7 lb 14.5 oz (3.585 kg) M Vag-Spont EPI  LIV    History reviewed. No pertinent past medical history.  Past Surgical History:  Procedure Laterality Date   TONSILLECTOMY      Current Outpatient Medications on File Prior to Visit  Medication Sig Dispense Refill   loratadine-pseudoephedrine (CLARITIN-D 24 HOUR) 10-240 MG 24 hr tablet Take 1 tablet by mouth daily as needed.     No current facility-administered medications on file prior to visit.    No Known Allergies  Social History:  reports that she has never smoked. She has never used smokeless tobacco. She reports that she does not currently use alcohol. She reports that she does not use drugs.  Family History  Problem Relation Age of Onset   Cancer Mother 37       Brease   Heart defect Maternal Uncle     The following portions of the patient's history were reviewed and updated as appropriate: allergies, current medications, past family history, past medical history, past social history, past surgical history and problem list.  Review of Systems Pertinent items noted in HPI and remainder of comprehensive ROS otherwise negative.  Physical Exam:  BP  (!) 102/58   Pulse 60   Resp 16   Ht 5\' 4"  (1.626 m)   Wt 130 lb (59 kg)   BMI 22.31 kg/m  CONSTITUTIONAL: Well-developed, well-nourished female in no acute distress.  HENT:  Normocephalic, atraumatic, External right and left ear normal.  EYES: Conjunctivae and EOM are normal. Pupils are equal, round, and reactive to light. No scleral icterus.  NECK: Normal range of motion, supple, no masses.  Normal thyroid.  SKIN: Skin is warm and dry. No rash noted. Not diaphoretic. No erythema. No pallor. MUSCULOSKELETAL: Normal range of motion. No tenderness.  No cyanosis, clubbing, or edema. NEUROLOGIC: Alert and oriented to person, place, and time. Normal reflexes, muscle tone coordination.  PSYCHIATRIC: Normal mood and affect. Normal behavior. Normal judgment and thought content.  CARDIOVASCULAR: Normal heart rate noted, regular rhythm RESPIRATORY: Clear to auscultation bilaterally. Effort and breath sounds normal, no problems with respiration noted.  BREASTS: Symmetric in size. No masses, tenderness, skin changes, nipple drainage, or lymphadenopathy bilaterally. Performed in the presence of a chaperone. ABDOMEN: Soft, no distention noted.  No tenderness, rebound or guarding.  PELVIC: External genitalia normal, Vagina normal without discharge, Urethra without abnormality or discharge, no bladder tenderness, cervix normal in appearance, no CMT, uterus normal size, shape, and consistency, no adnexal masses or tenderness, IUD strings visualized. Performed in the presence of a chaperone.  GYNECOLOGY OFFICE PROCEDURE NOTE  IUD Removal  Patient identified, informed consent performed, consent signed.  Patient was in the dorsal lithotomy position, normal external genitalia was noted.  A speculum was placed in the patient's vagina, normal discharge was noted, no lesions. The cervix was visualized, no lesions, no abnormal discharge.  The strings of the IUD were grasped and pulled using ring forceps. The IUD  was removed in its entirety.   Patient tolerated the procedure well.     Assessment and Plan:    1. Encounter for IUD removal - IUD removed without issue - Discussed conception - Reviewed PNV - Declines other form of BC until TTC  2. Encounter for gynecological examination - Cervical cancer screening: Discussed screening Q3 years. Reviewed importance of annual exams and limits of pap smear. Pap with reflex HPV done    - GC/CT: Discussed and recommended. Pt  declines - Gardasil:  did not review - Birth Control: Discussed options and their risks, benefits and common side effects; discussed VTE with estrogen containing options. Desires:  NFP - Breast Health: Encouraged self breast awareness/exams. Teaching provided. - Follow-up: 12 months and prn      Routine preventative health maintenance measures emphasized. Please refer to After Visit Summary for other counseling recommendations.      Milas Hock, MD, FACOG Obstetrician & Gynecologist, [facility] for [facility], [facility]

## 2020-10-29 LAB — CYTOLOGY - PAP: Diagnosis: NEGATIVE

## 2021-02-26 DIAGNOSIS — J0110 Acute frontal sinusitis, unspecified: Secondary | ICD-10-CM | POA: Diagnosis not present

## 2021-03-21 ENCOUNTER — Encounter: Payer: BC Managed Care – PPO | Admitting: Obstetrics and Gynecology

## 2021-04-07 DIAGNOSIS — Z3480 Encounter for supervision of other normal pregnancy, unspecified trimester: Secondary | ICD-10-CM | POA: Insufficient documentation

## 2021-04-07 NOTE — Progress Notes (Signed)
? ?History:  ? Christy Durham is a 31 y.o. G2P1001 at [redacted]w[redacted]d by L=10w [place] being seen today for her first obstetrical visit.  Patient does intend to breast feed - she was able ot nurse for about 3 months before switching to formula due to supply issues.  ? ?Pregnancy history fully reviewed. Obstetrical history is significant for a previous normal SVD at term without complications. ? ?Patient reports no complaints. ?  ? ?  ?HISTORY: ?OB History  ?Gravida Para Term Preterm AB Living  ?2 1 1  0 0 1  ?SAB IAB Ectopic Multiple Live Births  ?0 0 0 0 1  ?  ?# Outcome Date GA Lbr Len/2nd Weight Sex Delivery Anes PTL Lv  ?2 Current           ?1 Term 10/02/18 [redacted]w[redacted]d 24:25 / 01:16 7 lb 14.5 oz (3.585 kg) M Vag-Spont EPI  LIV  ?   Name: Toren,BOY Merilee  ?   Apgar1: 6  Apgar5: 9  ?  ? ?Last pap smear was done: ?Lab Results  ?Component Value Date  ? DIAGPAP  10/21/2020  ?  - Negative for intraepithelial lesion or malignancy (NILM)  ? ? ? ?History reviewed. No pertinent past medical history. ?Past Surgical History:  ?Procedure Laterality Date  ? TONSILLECTOMY    ? ?Family History  ?Problem Relation Age of Onset  ? Cancer Mother 61  ?     Brease  ? Heart defect Maternal Uncle   ? ?Social History  ? ?Tobacco Use  ? Smoking status: Never  ? Smokeless tobacco: Never  ?Vaping Use  ? Vaping Use: Never used  ?Substance Use Topics  ? Alcohol use: Not Currently  ? Drug use: Never  ? ?No Known Allergies ?Current Outpatient Medications on File Prior to Visit  ?Medication Sig Dispense Refill  ? Prenatal Vit-Fe Fumarate-FA (PRENATAL VITAMIN PO) Take by mouth.    ? ?No current facility-administered medications on file prior to visit.  ? ? ?Review of Systems ?Pertinent items noted in HPI and remainder of comprehensive ROS otherwise negative. ? ?Physical Exam:  ? ?Vitals:  ? 04/10/21 0813  ?BP: (!) 121/58  ?Pulse: 85  ?Weight: 134 lb (60.8 kg)  ? ?Fetal Heart Rate (bpm): 167 ?Bedside Ultrasound for FHR check: Viable intrauterine pregnancy with  positive cardiac activity noted, fetal heart rate 167 bpm ? ?Patient informed that the ultrasound is considered a limited obstetric ultrasound and is not intended to be a complete ultrasound exam.  Patient also informed that the ultrasound is not being completed with the intent of assessing for fetal or placental anomalies or any pelvic abnormalities.  Explained that the purpose of today?s ultrasound is to assess for fetal heart rate.  Patient acknowledges the purpose of the exam and the limitations of the study. ? ?General: well-developed, well-nourished female in no acute distress  ?Breasts:  normal appearance, no masses or tenderness bilaterally  ?Skin: normal coloration and turgor, no rashes  ?Neurologic: oriented, normal, negative, normal mood  ?Extremities: normal strength, tone, and muscle mass, ROM of all joints is normal  ?HEENT PERRLA, extraocular movement intact and sclera clear, anicteric  ?Neck supple and no masses  ?Cardiovascular: regular rate and rhythm  ?Respiratory:  no respiratory distress, normal breath sounds  ?Abdomen: soft, non-tender; bowel sounds normal; no masses,  no organomegaly  ?   ?  ?Assessment:  ?  ?Pregnancy: G1P1001 ?Patient Active Problem List  ? Diagnosis Date Noted  ? Supervision of other normal pregnancy, antepartum  04/07/2021  ? ?  ?Plan:  ?  ?1. Supervision of other normal pregnancy, antepartum ?Initial labs drawn. ?Continue prenatal vitamins. ?Problem list reviewed and updated. ?Genetic Screening discussed, NIPS: ordered. ?Ultrasound discussed; fetal anatomic survey: ordered. ?Anticipatory guidance about prenatal visits given including labs, ultrasounds, and testing. ?Discussed usage of Babyscripts and virtual visits ? ?The nature of [place] [facility] for [facility] Healthcare/Faculty Practice with multiple MDs and Advanced Practice Providers was explained to patient; also emphasized that residents, students are part of our team. ?Routine obstetric precautions reviewed.  Encouraged to seek out care at office or emergency room [facility] MAU preferred) for urgent and/or emergent concerns. ?No follow-ups on file.  ?  ?Milas Hock, MD, FACOG ?Obstetrician Heritage manager, Faculty Practice ?Center for [employer], [facility] ? ?

## 2021-04-08 ENCOUNTER — Encounter: Payer: Self-pay | Admitting: *Deleted

## 2021-04-09 NOTE — Progress Notes (Signed)
[place] pap- 10/21/20- negative ?

## 2021-04-10 ENCOUNTER — Ambulatory Visit: Payer: Self-pay

## 2021-04-10 ENCOUNTER — Other Ambulatory Visit (HOSPITAL_COMMUNITY)
Admission: RE | Admit: 2021-04-10 | Discharge: 2021-04-10 | Disposition: A | Discharge status: Home / Self Care | Payer: BC Managed Care – PPO | Source: Ambulatory Visit | Attending: Obstetrics and Gynecology | Admitting: Obstetrics and Gynecology

## 2021-04-10 ENCOUNTER — Encounter: Payer: Self-pay | Admitting: Obstetrics and Gynecology

## 2021-04-10 ENCOUNTER — Other Ambulatory Visit: Payer: Self-pay

## 2021-04-10 ENCOUNTER — Ambulatory Visit (INDEPENDENT_AMBULATORY_CARE_PROVIDER_SITE_OTHER): Payer: BC Managed Care – PPO | Admitting: Obstetrics and Gynecology

## 2021-04-10 VITALS — BP 121/58 | HR 85 | Wt 134.0 lb

## 2021-04-10 DIAGNOSIS — Z3A10 10 weeks gestation of pregnancy: Secondary | ICD-10-CM | POA: Diagnosis not present

## 2021-04-10 DIAGNOSIS — Z3480 Encounter for supervision of other normal pregnancy, unspecified trimester: Secondary | ICD-10-CM | POA: Diagnosis not present

## 2021-04-10 NOTE — Progress Notes (Signed)
Bedside U/S shows single IUP with FHT of 167 BPM and CRL measing 31.11mm  GA 10w ?

## 2021-04-11 LAB — GC/CHLAMYDIA PROBE AMP (~~LOC~~) NOT AT ARMC
Chlamydia: NEGATIVE
Comment: NEGATIVE
Comment: NORMAL
Neisseria Gonorrhea: NEGATIVE

## 2021-04-12 LAB — OBSTETRIC PANEL
Absolute Monocytes: 558 cells/uL (ref 200–950)
Antibody Screen: NOT DETECTED
Basophils Absolute: 41 cells/uL (ref 0–200)
Basophils Relative: 0.6 %
Eosinophils Absolute: 48 cells/uL (ref 15–500)
Eosinophils Relative: 0.7 %
HCT: 37.2 % (ref 35.0–45.0)
Hemoglobin: 12.6 g/dL (ref 11.7–15.5)
Hepatitis B Surface Ag: NONREACTIVE
Lymphs Abs: 1659 cells/uL (ref 850–3900)
MCH: 32.2 pg (ref 27.0–33.0)
MCHC: 33.9 g/dL (ref 32.0–36.0)
MCV: 95.1 fL (ref 80.0–100.0)
MPV: 11.8 fL (ref 7.5–12.5)
Monocytes Relative: 8.2 %
Neutro Abs: 4495 cells/uL (ref 1500–7800)
Neutrophils Relative %: 66.1 %
Platelets: 285 10*3/uL (ref 140–400)
RBC: 3.91 10*6/uL (ref 3.80–5.10)
RDW: 12.3 % (ref 11.0–15.0)
RPR Ser Ql: NONREACTIVE
Rubella: 1.07 Index
Total Lymphocyte: 24.4 %
WBC: 6.8 10*3/uL (ref 3.8–10.8)

## 2021-04-12 LAB — CULTURE, OB URINE

## 2021-04-12 LAB — URINE CULTURE, OB REFLEX

## 2021-04-12 LAB — HEPATITIS C ANTIBODY
Hepatitis C Ab: NONREACTIVE
SIGNAL TO CUT-OFF: 0.1 (ref <1.00)

## 2021-04-12 LAB — HIV ANTIBODY (ROUTINE TESTING W REFLEX): HIV 1&2 Ab, 4th Generation: NONREACTIVE

## 2021-04-14 DIAGNOSIS — J029 Acute pharyngitis, unspecified: Secondary | ICD-10-CM | POA: Diagnosis not present

## 2021-04-15 DIAGNOSIS — J029 Acute pharyngitis, unspecified: Secondary | ICD-10-CM | POA: Diagnosis not present

## 2021-04-17 ENCOUNTER — Other Ambulatory Visit: Payer: Self-pay

## 2021-04-17 ENCOUNTER — Encounter: Payer: Self-pay | Admitting: Obstetrics and Gynecology

## 2021-04-17 ENCOUNTER — Ambulatory Visit (INDEPENDENT_AMBULATORY_CARE_PROVIDER_SITE_OTHER): Payer: BC Managed Care – PPO | Admitting: Obstetrics and Gynecology

## 2021-04-17 VITALS — BP 115/68 | HR 98 | Wt 131.0 lb

## 2021-04-17 DIAGNOSIS — Z3480 Encounter for supervision of other normal pregnancy, unspecified trimester: Secondary | ICD-10-CM

## 2021-04-17 NOTE — Progress Notes (Signed)
? ?  PRENATAL VISIT NOTE ? ?Subjective:  ?Christy Durham is a 31 y.o. G2P1001 at [redacted]w[redacted]d being seen today for ongoing prenatal care.  She is currently monitored for the following issues for this low-risk pregnancy and has Supervision of other normal pregnancy, antepartum on their problem list. ? ?Patient reports  brown discharge .  Contractions: Not present. Vag. Bleeding: Scant.  Movement: Absent. Denies leaking of fluid.  ? ?The following portions of the patient's history were reviewed and updated as appropriate: allergies, current medications, past family history, past medical history, past social history, past surgical history and problem list.  ? ?Objective:  ? ?Vitals:  ? 04/17/21 1514  ?BP: 115/68  ?Pulse: 98  ?Weight: 131 lb (59.4 kg)  ? ? ?Fetal Status: Fetal Heart Rate (bpm): 180   Movement: Absent    ? ?General:  Alert, oriented and cooperative. Patient is in no acute distress.  ?Skin: Skin is warm and dry. No rash noted.   ?Cardiovascular: Normal heart rate noted  ?Respiratory: Normal respiratory effort, no problems with respiration noted  ?Abdomen: Soft, gravid, appropriate for gestational age.  Pain/Pressure: Absent     ?Pelvic: Cervical exam deferred        ?Extremities: Normal range of motion.     ?Mental Status: Normal mood and affect. Normal behavior. Normal judgment and thought content.  ? ?Assessment and Plan:  ?Pregnancy: G2P1001 at [redacted]w[redacted]d ?1. Supervision of other normal pregnancy, antepartum ?- Brown spotting - pt here for reassurance. FHT obtained without issue.  ? ? ?Future Appointments  ?Date Time Provider [place]  ?04/25/2021  9:00 AM [place] [facility]  ?06/06/2021 11:10 AM Nugent, Gerrie Nordmann, NP [facility]  ?06/13/2021  2:30 PM [facility] US2 [facility] [place]  ? ? ?Radene Gunning, MD ?

## 2021-04-22 LAB — HEPATITIS C ANTIBODY: HCV Ab: NEGATIVE

## 2021-04-25 ENCOUNTER — Other Ambulatory Visit: Payer: Self-pay

## 2021-04-25 ENCOUNTER — Ambulatory Visit (INDEPENDENT_AMBULATORY_CARE_PROVIDER_SITE_OTHER): Payer: BC Managed Care – PPO

## 2021-04-25 DIAGNOSIS — Z3490 Encounter for supervision of normal pregnancy, unspecified, unspecified trimester: Secondary | ICD-10-CM

## 2021-04-25 DIAGNOSIS — Z3482 Encounter for supervision of other normal pregnancy, second trimester: Secondary | ICD-10-CM | POA: Diagnosis not present

## 2021-04-25 NOTE — Progress Notes (Signed)
NIPS drawn.  

## 2021-05-05 ENCOUNTER — Encounter: Payer: Self-pay | Admitting: *Deleted

## 2021-06-06 ENCOUNTER — Ambulatory Visit (INDEPENDENT_AMBULATORY_CARE_PROVIDER_SITE_OTHER): Payer: BC Managed Care – PPO | Admitting: Women's Health

## 2021-06-06 VITALS — BP 120/74 | HR 70 | Wt 139.0 lb

## 2021-06-06 DIAGNOSIS — Z3480 Encounter for supervision of other normal pregnancy, unspecified trimester: Secondary | ICD-10-CM

## 2021-06-06 DIAGNOSIS — Z3A18 18 weeks gestation of pregnancy: Secondary | ICD-10-CM

## 2021-06-06 NOTE — Progress Notes (Signed)
Subjective:  ?Christy Durham is a 31 y.o. G2P1001 at [redacted]w[redacted]d being seen today for ongoing prenatal care.  She is currently monitored for the following issues for this low-risk pregnancy and has Supervision of other normal pregnancy, antepartum on their problem list. ? ?Patient reports no complaints.  Contractions: Not present. Vag. Bleeding: None.  Movement: Absent. Denies leaking of fluid.  ? ?The following portions of the patient's history were reviewed and updated as appropriate: allergies, current medications, past family history, past medical history, past social history, past surgical history and problem list. Problem list updated. ? ?Objective:  ? ?Vitals:  ? 06/06/21 1111  ?BP: 120/74  ?Pulse: 70  ?Weight: 139 lb (63 kg)  ? ? ?Fetal Status: Fetal Heart Rate (bpm): 150   Movement: Absent    ? ?General:  Alert, oriented and cooperative. Patient is in no acute distress.  ?Skin: Skin is warm and dry. No rash noted.   ?Cardiovascular: Normal heart rate noted  ?Respiratory: Normal respiratory effort, no problems with respiration noted  ?Abdomen: Soft, gravid, appropriate for gestational age. Pain/Pressure: Absent     ?Pelvic: Vag. Bleeding: None Vag D/C Character: Thin   ?Cervical exam deferred        ?Extremities: Normal range of motion.  Edema: None  ?Mental Status: Normal mood and affect. Normal behavior. Normal judgment and thought content.  ? ?Urinalysis:     ? ?Assessment and Plan:  ?Pregnancy: G2P1001 at [redacted]w[redacted]d ? ?1. Supervision of other normal pregnancy, antepartum ?- Alpha fetoprotein, maternal ?-peds list given ?-CBE info given ? ?2. [redacted] weeks gestation of pregnancy ? ?Preterm labor symptoms and general obstetric precautions including but not limited to vaginal bleeding, contractions, leaking of fluid and fetal movement were reviewed in detail with the patient. ?I discussed the assessment and treatment plan with the patient. The patient was provided an opportunity to ask questions and all were answered. The  patient agreed with the plan and demonstrated an understanding of the instructions. The patient was advised to call back or seek an in-person office evaluation/go to MAU at [facility] for any urgent or concerning symptoms. ?Please refer to After Visit Summary for other counseling recommendations.  ?Return in about 4 weeks (around 07/04/2021) for in-person LOB/APP OK. ? ? ?Kym Fenter, Gerrie Nordmann, NP ?

## 2021-06-06 NOTE — Patient Instructions (Signed)
Maternity Assessment Unit (MAU) ? ?The Maternity Assessment Unit (MAU) is located at the [facility] and [place] at [facility]. The address is: [address], [place], [place], [place] [address] [address]. Please see map below for additional directions. ? ? ? ?The Maternity Assessment Unit is designed to help you during your pregnancy, and for up to 6 weeks after delivery, with any pregnancy- or postpartum-related emergencies, if you think you are in labor, or if your water has broken. For example, if you experience nausea and vomiting, vaginal bleeding, severe abdominal or pelvic pain, elevated blood pressure or other problems related to your pregnancy or postpartum time, please come to the Maternity Assessment Unit for assistance. ? ? ? ? ? ? ?AREA PEDIATRIC/FAMILY PRACTICE PHYSICIANS ? ?[facility] ?[address] ?[address] ?[place], [place] [address] ?Phone - [phone]   Fax - [phone] ? ?[facility] ?[address] ?[place], [place]  [address] ?Phone - [phone]   Fax - [phone] ? ?[place] ?[place] [address] ?[place], [place]  [address] ?Phone - [phone]   Fax - [phone] ? ?[facility] ?[place] ?[place], [place]  [address] ?Phone - [phone]   Fax - [phone] ? ?[facility] [employer] ?[place], [place]  [address] ?Phone - [phone]   Fax - [phone] ? ?[facility] ?[address] ?[place], [place]  [address] ?Phone - [phone]   Fax - [phone] ? ?[facility] ?[address] ?[place], [place]  [address] ?Phone - [phone]   Fax - [phone] ? ?[facility] ?[place] ?[place], [place]  [address] ?Phone - [phone]   Fax - [phone] ? ?[facility] ?[place] ?[place], [place]  [address] ?Phone - [phone]   Fax -  [phone] ?[facility] [place] ?[address] ?[place], [place]  [address] ?Phone - [phone]   Fax - [phone] ? ?[facility] [place] ?[address] ?[place], [place]  [address] ?Phone - [phone]   Fax - [phone] ? ?[facility] ?[place] ?[place], [place]  [address] ?Phone - [phone]   Fax - [phone] ? ?[facility] ?[place] [employer], [address] ?[place], [place]  [address] ?Phone - [phone]   Fax - [phone] ? ?[facility] ?[address] ?[place], [place]  [address] ?Phone - [phone] ? ?[facility] ?[place] ?[place], [place]  [address] ?Phone - [phone]   Fax - [phone] ? ?[place] ?[address] ?[place], [place]  [address] ?Phone - [phone]   Fax - [phone] ? ?[facility] ?[address] ?[place], [place]  [address] ?Phone - [phone]   Fax - [phone] ? ?[facility] ?[address] ?[place], [place]  [address] ?Phone - [phone]   Fax - [phone] ? ? ?[facility] ?[place] [place] ?[place], [place]  [address] ?Phone - [phone]   Fax - [phone] ? ?[place] ?[address] [place],   [address] ?Phone - [phone]   Fax - [phone] ? ?[facility] ?[address] ?[place],   [address] ?Phone - [phone]   Fax - [phone] ? ?[facility] [employer], [address] ?[place],   [address] ?Phone - [phone]  Fax - [phone] ? ?[facility] [place] ?[address] ?[place], [place]  [address] ?Phone - [phone]   Fax - [phone] ?[place] [facility] ?[address] ?[place], [place]  [address] ?Phone - [phone]   Fax - [phone] ? ?[facility] ?[address] ?[place], [place]  [address] ?Phone - [phone]   Fax - [phone] ? ?[facility] ?[address] ?[place],   [address] ?Phone - [phone]   Fax - [phone] ? ? ? ? ? ? ? ?Childbirth Education Options: ?[facility] Classes:  ?Childbirth education classes can help you get ready for a positive parenting experience. You can also meet other expectant parents and get free stuff for your baby. Each class runs for five weeks on the same night and costs $45 for the mother-to-be and her support person. Medicaid covers the cost if you are eligible. Call [phone] to register. ?[facility] Childbirth Education: ?Classes can vary in availability and schedule is subject to change. For most up-to-date information please visit [email or web address] to review and register.  ? ? ? ? ? ? ? ? ? ? ? ? ?

## 2021-06-06 NOTE — Progress Notes (Signed)
PHQ 9 Score: 0 ?GAD 7 Score: 1 ?

## 2021-06-09 ENCOUNTER — Telehealth: Payer: Self-pay | Admitting: *Deleted

## 2021-06-09 DIAGNOSIS — O285 Abnormal chromosomal and genetic finding on antenatal screening of mother: Secondary | ICD-10-CM

## 2021-06-09 LAB — ALPHA FETOPROTEIN, MATERNAL
AFP MoM: POSITIVE — AB
AFP, Serum: 197.7 ng/mL
Calc'd Gestational Age: 18.4 weeks
Maternal Wt: 139 [lb_av]
Risk for ONTD: 1
Twins-AFP: 1

## 2021-06-09 NOTE — Telephone Encounter (Signed)
Pt notified of positive NTD screening.  Referral placed for MFM genetics and pt already has anatomy scan scheduled. ?

## 2021-06-10 ENCOUNTER — Encounter: Payer: Self-pay | Admitting: Women's Health

## 2021-06-10 ENCOUNTER — Other Ambulatory Visit: Payer: Self-pay | Admitting: Women's Health

## 2021-06-10 ENCOUNTER — Telehealth: Payer: Self-pay | Admitting: Women's Health

## 2021-06-10 DIAGNOSIS — R772 Abnormality of alphafetoprotein: Secondary | ICD-10-CM | POA: Insufficient documentation

## 2021-06-10 NOTE — Telephone Encounter (Signed)
Error

## 2021-06-12 ENCOUNTER — Other Ambulatory Visit: Payer: Self-pay

## 2021-06-12 DIAGNOSIS — R772 Abnormality of alphafetoprotein: Secondary | ICD-10-CM

## 2021-06-12 DIAGNOSIS — Z3480 Encounter for supervision of other normal pregnancy, unspecified trimester: Secondary | ICD-10-CM

## 2021-06-13 ENCOUNTER — Ambulatory Visit: Payer: BC Managed Care – PPO | Attending: Obstetrics and Gynecology | Admitting: Maternal & Fetal Medicine

## 2021-06-13 ENCOUNTER — Ambulatory Visit: Payer: BC Managed Care – PPO | Admitting: *Deleted

## 2021-06-13 ENCOUNTER — Ambulatory Visit: Payer: BC Managed Care – PPO | Attending: Obstetrics and Gynecology

## 2021-06-13 VITALS — BP 126/56 | HR 86

## 2021-06-13 DIAGNOSIS — O289 Unspecified abnormal findings on antenatal screening of mother: Secondary | ICD-10-CM | POA: Insufficient documentation

## 2021-06-13 DIAGNOSIS — Z363 Encounter for antenatal screening for malformations: Secondary | ICD-10-CM | POA: Insufficient documentation

## 2021-06-13 DIAGNOSIS — Z3480 Encounter for supervision of other normal pregnancy, unspecified trimester: Secondary | ICD-10-CM

## 2021-06-13 DIAGNOSIS — O418X20 Other specified disorders of amniotic fluid and membranes, second trimester, not applicable or unspecified: Secondary | ICD-10-CM

## 2021-06-13 DIAGNOSIS — Z3A19 19 weeks gestation of pregnancy: Secondary | ICD-10-CM | POA: Insufficient documentation

## 2021-06-13 DIAGNOSIS — R772 Abnormality of alphafetoprotein: Secondary | ICD-10-CM

## 2021-06-13 DIAGNOSIS — O4592 Premature separation of placenta, unspecified, second trimester: Secondary | ICD-10-CM | POA: Insufficient documentation

## 2021-06-13 NOTE — Progress Notes (Signed)
MFM Brief Note ? ?Christy Durham is a 12 G2P1 who is here at [redacted]w[redacted]d with an EDD of 11/04/21 at the request of Dr. Milas Hock regarding an elevated AFP of 4 MoM. ? ?She is overall doing well. She has a low risk NIPS and not additional risk factors. ? ?Single intrauterine pregnancy here for a detailed anatomy due to elevate AFP ?Normal anatomy with measurements consistent with dates ?There is good fetal movement and amniotic fluid volume ? ?Today we observed a normal fetal spine, intracranial anatomy and anterior abdominal wall. However, we did see a large subamniotic hematoma measuring about 10 cm in length and 3-5 cm in depth. I discussed that cause for an elevated AFP > 2.5 MoM ONTD, anterior abdominal wall defect, signficant bleeding, placental insufficiency and other rare cause such renal nephrosis. At this time I suspect that the elevated AFP is due to the large subamniotic hematoma. ? ?I discussed that this place the pregnancy at risk for fetal growth restriction and or preterm delivery secondary to premature rupture of membranes or preterm delivery. Given this we recommend serial growth exams.  ? ?Secondly, we discussed that when an AFP is elevated diagnostic testing is available to rule out true elevated intraamniotic acytelcholenesterase levels. At this time she declined given the current bleed and risk of perinatal loss.  ? ?I explained that she may experince bleeding which should be dark to brown in color, if bright red bleeding were to occurs she should contact her providers. ? ?All questions answered. ? ?I spent 30 minutes with > 50 % in face to face consultation. ? ?Novella Olive, MD ?

## 2021-06-16 ENCOUNTER — Other Ambulatory Visit: Payer: Self-pay | Admitting: *Deleted

## 2021-06-16 DIAGNOSIS — R772 Abnormality of alphafetoprotein: Secondary | ICD-10-CM

## 2021-06-17 ENCOUNTER — Ambulatory Visit: Payer: BC Managed Care – PPO | Attending: Obstetrics and Gynecology | Admitting: Obstetrics and Gynecology

## 2021-06-17 ENCOUNTER — Other Ambulatory Visit: Payer: Self-pay

## 2021-06-17 DIAGNOSIS — R772 Abnormality of alphafetoprotein: Secondary | ICD-10-CM | POA: Diagnosis not present

## 2021-06-17 NOTE — Progress Notes (Signed)
Referring Provider:  Dr. Milas Hock ?Length of consultation: 30 minutes ? ?Virtual Visit via Video Note ? ?I connected with Christy Durham on 06/17/21 at  2:00 PM EDT by a video enabled telemedicine application and verified that I am speaking with the correct person using two identifiers. ? ?Location: ?Patient: home ?Provider: [facility] ?  ?I discussed the limitations of evaluation and management by telemedicine and the availability of in person appointments. The patient expressed understanding and agreed to proceed. ? ?Ms. Bebout was referred for genetic counseling at [facility] to discuss the results of her Maternal Serum AFP Screen and her prenatal testing options. The patient was present at the visit alone and stated that there were no persons she wanted to be included in the call. ? ?To review, a Maternal Serum AFP is a maternal blood test that measures maternal serum AFP levels to determine if a pregnancy is at higher risk for certain birth differences or complications; however, it cannot diagnose or rule out these conditions.  It is important to remember that an abnormal maternal serum screen (MSS) test does not necessarily mean that the baby has a problem. ? ?Maternal serum screening can identify approximately 80% of open neural tube defects (i.e., spina bifida).  The neural tube consists of the fetal head and spine, and if this structure fails to close during development, spina bifida (open spine) or anencephaly (open skull) could result.  ? ?The MSS result revealed an increased level of AFP.  The value was 4.00 MOM (multiples of the median) which is above the cutoff of 2.50 MOM.  Increased levels of AFP occur for many reasons including: inaccurate pregnancy dating, the presence of twins, pregnancy complications, neural tube defects, abdominal wall defects, rare genetic conditions or a normal pregnancy.  Before MSS screening, her risk to have a child with a neural tube defect  was 1 or 2 per 1000. Given the elevated AFP value, the risk is now 1 in 26.  This means that the chance that this baby does not have a neural tube defect is >96%. ? ?We discussed the following prenatal testing options for this pregnancy:  ? ?Ultrasound was recommended to evaluate the fetal anatomy, particularly the neural tube and abdominal wall, and to confirm the gestational age.  An ultrasound performed on 06/13/21 confirmed the gestational age to be 19 weeks.  There was no evidence of a neural tube defect or other structural anomalies on this ultrasound.  The fetal anatomy appeared normal, although this cannot rule out all abnormalities. There was, however, a large subamniotic hematoma which is suspected to be the cause for the elevated AFP.  See the MFM consultation/ultrasound note for details.  ? ?Redraw AFP is offered when the level of AFP MoM does not exceed a particular laboratory cutoff.  Redrawing maternal blood for a repeat can provide patients with a better assessment of their risk to have a child with a neural tube defect. Ms. Lemonds and I reviewed this option, but her value is greater than 3.0, which is typically the cutoff for repeating, and it would not likely change the management of the pregnancy. ? ?Amniocentesis was also offered.  Amniocentesis can detect greater than 98% of neural tube defects.  It involves the removal of a small amount of amniotic fluid from the sac surrounding the fetus with the use of a thin needle through the maternal abdomen and uterus.  Fetal cells are directly evaluated and approximately 99% of chromosome  conditions may also be detected.  Ultrasound guidance is used throughout the procedure. The main risks to this procedure include complications leading to miscarriage in less than 1 in 500 cases (0.2%).   ? ?It is important to remember that each pregnancy in the general population has a 2-3% chance for a birth defect that might not be detected prenatally.  We recommend  0.4mg  of folic acid, a B-complex vitamin, for all women of childbearing age.  Folic acid may help to prevent neural tube defects, and should be taken prior to and during pregnancy. ? ?Cystic Fibrosis and Spinal Muscular Atrophy (SMA) screening were also discussed with the patient. Both conditions are recessive, which means that both parents must be carriers in order to have a child with the disease.  Cystic fibrosis (CF) is one of the most common genetic conditions in persons of Caucasian ancestry.  This condition occurs in approximately 1 in 2,500 Caucasian persons and results in thickened secretions in the lungs, digestive, and reproductive systems.  For a baby to be at risk for having CF, both of the parents must be carriers for this condition.  Approximately 1 in 67 Caucasian persons is a carrier for CF.  Current carrier testing looks for the most common mutations in the gene for CF and can detect approximately 90% of carriers in the Caucasian population.  This means that the carrier screening can greatly reduce, but cannot eliminate, the chance for an individual to have a child with CF.  If an individual is found to be a carrier for CF, then carrier testing would be available for the partner. As part of [place]?s newborn screening profile, all babies born in the state of [place] will have a two-tier screening process.  Specimens are first tested to determine the concentration of immunoreactive trypsinogen (IRT).  The top 5% of specimens with the highest IRT values then undergo DNA testing using a panel of over 40 common CF mutations. SMA is a neurodegenerative disorder that leads to atrophy of skeletal muscle and overall weakness.  This condition is also more prevalent in the Caucasian population, with 1 in 40-1 in 60 persons being a carrier and 1 in 6,000-1 in 10,000 children being affected.  There are multiple forms of the disease, with some causing death in infancy to other forms with survival  into adulthood.  The genetics of SMA is complex, but carrier screening can detect up to 95% of carriers in the Caucasian population.  Similar to CF, a negative result can greatly reduce, but cannot eliminate, the chance to have a child with SMA. Hemoglobinopathy screening was also made available. ? ?We obtained a detailed family history, and the patient reported that this is the second pregnancy for she and her husband, Amalia Hailey.  They have a healthy 79 year old son.  She stated that her mother had breast cancer at age 76 years with "negative genetic testing".  There is no other family history of breast cancer.  Most cases of breast cancer occur by chance without a known inherited predisposition.  The negative genetic testing is also reassuring, though we cannot comment on those results without documentation.  We encourage the family to speak with a cancer genetic counselor if questions arise about this history. Ms. Stamant also reported that a maternal uncle and maternal first cousin once removed needed heart surgery in childhood.  No details were known about the type of defect or cause for their conditions.  We discussed that structural heart  defects are among the most common type of birth difference.  In the absence of a known genetic condition or other birth defects, most are the result of a combination of inherited as well as environmental or other factors.  If more is learned, we are happy to review this further, though in relatives of this distance we would generally expect a low recurrence risk . There are no other family members with known genetic conditions, developmental delays or birth defects. ? ?We also obtained a detailed pregnancy history. Ms. Pecola LeisureManess reported no complications thus far and denied the use of prescription medications, recreational drugs, tobacco or alcohol. ? ?After careful consideration, Ms.Hollingsworth elected to declined amniocentesis and carrier screening for CF, SMA or  hemoglobinopathies. ? ?Unexplained elevations of maternal serum AFP have been associated with adverse pregnancy outcomes such as low birth weight, preterm labor, pre-eclampsia or fetal demise.  For this reason, another [employer]

## 2021-07-01 ENCOUNTER — Ambulatory Visit (INDEPENDENT_AMBULATORY_CARE_PROVIDER_SITE_OTHER): Payer: BC Managed Care – PPO | Admitting: Obstetrics and Gynecology

## 2021-07-01 VITALS — BP 122/64 | HR 102 | Wt 143.0 lb

## 2021-07-01 DIAGNOSIS — R772 Abnormality of alphafetoprotein: Secondary | ICD-10-CM

## 2021-07-01 DIAGNOSIS — Z3480 Encounter for supervision of other normal pregnancy, unspecified trimester: Secondary | ICD-10-CM

## 2021-07-01 NOTE — Progress Notes (Signed)
   PRENATAL VISIT NOTE  Subjective:  Christy Durham is a 31 y.o. G2P1001 at [redacted]w[redacted]d being seen today for ongoing prenatal care.  She is currently monitored for the following issues for this high-risk pregnancy and has Supervision of other normal pregnancy, antepartum and Elevated AFP on their problem list.  Patient reports no complaints.  Contractions: Not present. Vag. Bleeding: None.  Movement: Present. Denies leaking of fluid.   The following portions of the patient's history were reviewed and updated as appropriate: allergies, current medications, past family history, past medical history, past social history, past surgical history and problem list.   Objective:   Vitals:   07/01/21 1348  BP: 122/64  Pulse: (!) 102  Weight: 143 lb (64.9 kg)    Fetal Status: Fetal Heart Rate (bpm): 148   Movement: Present     General:  Alert, oriented and cooperative. Patient is in no acute distress.  Skin: Skin is warm and dry. No rash noted.   Cardiovascular: Normal heart rate noted  Respiratory: Normal respiratory effort, no problems with respiration noted  Abdomen: Soft, gravid, appropriate for gestational age.  Pain/Pressure: Absent     Pelvic: Cervical exam deferred        Extremities: Normal range of motion.  Edema: None  Mental Status: Normal mood and affect. Normal behavior. Normal judgment and thought content.   Assessment and Plan:  Pregnancy: G2P1001 at [redacted]w[redacted]d  1. Elevated AFP  Likely d/t subamniotic hemoatoma. She has no bleeding.  Continue Q 4 weeks growth [place]- may need weekly testing at some point.   2. Supervision of other normal pregnancy, antepartum  Doing great Colace is ok to use.    Preterm labor symptoms and general obstetric precautions including but not limited to vaginal bleeding, contractions, leaking of fluid and fetal movement were reviewed in detail with the patient. Please refer to After Visit Summary for other counseling recommendations.   Return in about 4  weeks (around 07/29/2021), or MD visit to discuss BTL, Glucose test so come fasting..  Future Appointments  Date Time Provider Department Center  07/11/2021  8:45 AM [facility] NURSE [facility]  07/11/2021  9:00 AM [facility] US1 [facility]  08/01/2021  8:30 AM Recie Cirrincione, Harolyn Rutherford, NP [facility]    Venia [place], NP

## 2021-07-04 ENCOUNTER — Encounter: Payer: BC Managed Care – PPO | Admitting: Obstetrics and Gynecology

## 2021-07-11 ENCOUNTER — Ambulatory Visit: Payer: BC Managed Care – PPO | Admitting: *Deleted

## 2021-07-11 ENCOUNTER — Ambulatory Visit: Payer: BC Managed Care – PPO | Attending: Maternal & Fetal Medicine

## 2021-07-11 ENCOUNTER — Encounter: Payer: Self-pay | Admitting: *Deleted

## 2021-07-11 ENCOUNTER — Other Ambulatory Visit: Payer: Self-pay | Admitting: *Deleted

## 2021-07-11 VITALS — BP 113/57 | HR 73

## 2021-07-11 DIAGNOSIS — Z3A23 23 weeks gestation of pregnancy: Secondary | ICD-10-CM

## 2021-07-11 DIAGNOSIS — R772 Abnormality of alphafetoprotein: Secondary | ICD-10-CM

## 2021-07-11 DIAGNOSIS — O288 Other abnormal findings on antenatal screening of mother: Secondary | ICD-10-CM | POA: Diagnosis not present

## 2021-07-11 DIAGNOSIS — O4592 Premature separation of placenta, unspecified, second trimester: Secondary | ICD-10-CM

## 2021-07-11 DIAGNOSIS — O418X20 Other specified disorders of amniotic fluid and membranes, second trimester, not applicable or unspecified: Secondary | ICD-10-CM

## 2021-07-18 ENCOUNTER — Ambulatory Visit: Payer: Self-pay

## 2021-08-01 ENCOUNTER — Ambulatory Visit (INDEPENDENT_AMBULATORY_CARE_PROVIDER_SITE_OTHER): Payer: BC Managed Care – PPO | Admitting: Obstetrics and Gynecology

## 2021-08-01 VITALS — BP 105/66 | HR 82 | Wt 150.0 lb

## 2021-08-01 DIAGNOSIS — Z23 Encounter for immunization: Secondary | ICD-10-CM

## 2021-08-01 DIAGNOSIS — Z3A26 26 weeks gestation of pregnancy: Secondary | ICD-10-CM

## 2021-08-01 DIAGNOSIS — Z3480 Encounter for supervision of other normal pregnancy, unspecified trimester: Secondary | ICD-10-CM | POA: Diagnosis not present

## 2021-08-02 LAB — RPR: RPR Ser Ql: NONREACTIVE

## 2021-08-04 LAB — CBC
HCT: 36.6 % (ref 35.0–45.0)
Hemoglobin: 12.5 g/dL (ref 11.7–15.5)
MCH: 33.9 pg — ABNORMAL HIGH (ref 27.0–33.0)
MCHC: 34.2 g/dL (ref 32.0–36.0)
MCV: 99.2 fL (ref 80.0–100.0)
MPV: 11.3 fL (ref 7.5–12.5)
Platelets: 227 10*3/uL (ref 140–400)
RBC: 3.69 10*6/uL — ABNORMAL LOW (ref 3.80–5.10)
RDW: 12.3 % (ref 11.0–15.0)
WBC: 9.8 10*3/uL (ref 3.8–10.8)

## 2021-08-04 LAB — 2HR GTT W 1 HR, CARPENTER, 75 G
Glucose, 1 Hr, Gest: 93 mg/dL (ref 65–179)
Glucose, 2 Hr, Gest: 80 mg/dL (ref 65–152)
Glucose, Fasting, Gest: 73 mg/dL (ref 65–91)

## 2021-08-04 LAB — HIV ANTIBODY (ROUTINE TESTING W REFLEX): HIV 1&2 Ab, 4th Generation: NONREACTIVE

## 2021-08-08 ENCOUNTER — Other Ambulatory Visit: Payer: Self-pay | Admitting: *Deleted

## 2021-08-08 ENCOUNTER — Ambulatory Visit: Payer: BC Managed Care – PPO | Attending: Obstetrics

## 2021-08-08 ENCOUNTER — Ambulatory Visit: Payer: BC Managed Care – PPO | Admitting: *Deleted

## 2021-08-08 ENCOUNTER — Encounter: Payer: Self-pay | Admitting: *Deleted

## 2021-08-08 VITALS — BP 120/63 | HR 80

## 2021-08-08 DIAGNOSIS — O418X20 Other specified disorders of amniotic fluid and membranes, second trimester, not applicable or unspecified: Secondary | ICD-10-CM | POA: Insufficient documentation

## 2021-08-08 DIAGNOSIS — R772 Abnormality of alphafetoprotein: Secondary | ICD-10-CM | POA: Diagnosis not present

## 2021-08-08 DIAGNOSIS — Z3A27 27 weeks gestation of pregnancy: Secondary | ICD-10-CM

## 2021-08-08 DIAGNOSIS — O468X2 Other antepartum hemorrhage, second trimester: Secondary | ICD-10-CM | POA: Insufficient documentation

## 2021-08-08 DIAGNOSIS — O365920 Maternal care for other known or suspected poor fetal growth, second trimester, not applicable or unspecified: Secondary | ICD-10-CM

## 2021-08-23 NOTE — Progress Notes (Unsigned)
   PRENATAL VISIT NOTE  Subjective:  Christy Durham is a 31 y.o. G2P1001 at [redacted]w[redacted]d being seen today for ongoing prenatal care.  She is currently monitored for the following issues for this low-risk pregnancy and has Supervision of other normal pregnancy, antepartum and Elevated AFP on their problem list.  Patient reports no complaints.  Contractions: Not present. Vag. Bleeding: None.  Movement: Present. Denies leaking of fluid.   The following portions of the patient's history were reviewed and updated as appropriate: allergies, current medications, past family history, past medical history, past social history, past surgical history and problem list.   Objective:   Vitals:   08/25/21 0812  BP: 110/70  Pulse: 87  Weight: 155 lb (70.3 kg)    Fetal Status: Fetal Heart Rate (bpm): 147   Movement: Present     General:  Alert, oriented and cooperative. Patient is in no acute distress.  Skin: Skin is warm and dry. No rash noted.   Cardiovascular: Normal heart rate noted  Respiratory: Normal respiratory effort, no problems with respiration noted  Abdomen: Soft, gravid, appropriate for gestational age.  Pain/Pressure: Absent     Pelvic: Cervical exam deferred        Extremities: Normal range of motion.  Edema: None  Mental Status: Normal mood and affect. Normal behavior. Normal judgment and thought content.   Assessment and Plan:  Pregnancy: G2P1001 at [redacted]w[redacted]d 1. Supervision of other normal pregnancy, antepartum 28w labs wnl, tdap done and rh pos  2. Elevated AFP Growth on 6/30 was 12%ile. Next growth on 7/28.   3. Unwanted fertility - Other reversible forms of contraception were discussed with patient; she declines all other modalities. Risks of procedure discussed with patient including but not limited to: risk of regret, permanence of method, bleeding, infection, injury to surrounding organs and need for additional procedures.  Failure risk of 1% with increased risk of ectopic gestation  if pregnancy occurs was also discussed with patient.   - Reviewed PPTL vs interval salpingectomy.  Latoi Giraldo would like to consider but is leaning toward interval salpingectomy     Preterm labor symptoms and general obstetric precautions including but not limited to vaginal bleeding, contractions, leaking of fluid and fetal movement were reviewed in detail with the patient. Please refer to After Visit Summary for other counseling recommendations.   No follow-ups on file.  Future Appointments  Date Time Provider Department Center  09/05/2021  7:15 AM [facility] NURSE [facility]  09/05/2021  7:30 AM [facility] US3 [facility]    Milas Hock, MD

## 2021-08-25 ENCOUNTER — Encounter: Payer: Self-pay | Admitting: Obstetrics and Gynecology

## 2021-08-25 ENCOUNTER — Ambulatory Visit (INDEPENDENT_AMBULATORY_CARE_PROVIDER_SITE_OTHER): Payer: BC Managed Care – PPO | Admitting: Obstetrics and Gynecology

## 2021-08-25 VITALS — BP 110/70 | HR 87 | Wt 155.0 lb

## 2021-08-25 DIAGNOSIS — Z3480 Encounter for supervision of other normal pregnancy, unspecified trimester: Secondary | ICD-10-CM

## 2021-08-25 DIAGNOSIS — R772 Abnormality of alphafetoprotein: Secondary | ICD-10-CM

## 2021-08-25 DIAGNOSIS — Z3009 Encounter for other general counseling and advice on contraception: Secondary | ICD-10-CM

## 2021-08-25 DIAGNOSIS — Z3A29 29 weeks gestation of pregnancy: Secondary | ICD-10-CM

## 2021-09-05 ENCOUNTER — Other Ambulatory Visit: Payer: Self-pay | Admitting: *Deleted

## 2021-09-05 ENCOUNTER — Ambulatory Visit: Payer: BC Managed Care – PPO | Attending: Maternal & Fetal Medicine

## 2021-09-05 ENCOUNTER — Ambulatory Visit: Payer: BC Managed Care – PPO | Admitting: *Deleted

## 2021-09-05 ENCOUNTER — Encounter: Payer: Self-pay | Admitting: *Deleted

## 2021-09-05 VITALS — BP 113/62 | HR 76

## 2021-09-05 DIAGNOSIS — Z3A31 31 weeks gestation of pregnancy: Secondary | ICD-10-CM

## 2021-09-05 DIAGNOSIS — R772 Abnormality of alphafetoprotein: Secondary | ICD-10-CM | POA: Insufficient documentation

## 2021-09-05 DIAGNOSIS — O280 Abnormal hematological finding on antenatal screening of mother: Secondary | ICD-10-CM

## 2021-09-05 DIAGNOSIS — O4593 Premature separation of placenta, unspecified, third trimester: Secondary | ICD-10-CM

## 2021-09-05 DIAGNOSIS — O365920 Maternal care for other known or suspected poor fetal growth, second trimester, not applicable or unspecified: Secondary | ICD-10-CM | POA: Diagnosis not present

## 2021-09-11 NOTE — Progress Notes (Signed)
   PRENATAL VISIT NOTE  Subjective:  Christy Durham is a 31 y.o. G2P1001 at [redacted]w[redacted]d being seen today for ongoing prenatal care.  She is currently monitored for the following issues for this low-risk pregnancy and has Supervision of other normal pregnancy, antepartum and Elevated AFP on their problem list.  Patient reports no complaints.  Contractions: Not present. Vag. Bleeding: None.  Movement: Present. Denies leaking of fluid.   The following portions of the patient's history were reviewed and updated as appropriate: allergies, current medications, past family history, past medical history, past social history, past surgical history and problem list.   Objective:   Vitals:   09/18/21 0808  BP: 111/70  Pulse: 92  Weight: 159 lb (72.1 kg)    Fetal Status: Fetal Heart Rate (bpm): 156 Fundal Height: 31 cm Movement: Present     General:  Alert, oriented and cooperative. Patient is in no acute distress.  Skin: Skin is warm and dry. No rash noted.   Cardiovascular: Normal heart rate noted  Respiratory: Normal respiratory effort, no problems with respiration noted  Abdomen: Soft, gravid, appropriate for gestational age.  Pain/Pressure: Absent     Pelvic: Cervical exam deferred        Extremities: Normal range of motion.  Edema: None  Mental Status: Normal mood and affect. Normal behavior. Normal judgment and thought content.   Assessment and Plan:  Pregnancy: G2P1001 at [redacted]w[redacted]d 1. Supervision of other normal pregnancy, antepartum Revisited plan for birth control (interval vs PPTL) - she would like PPTL  2. Elevated AFP MFM recommended delivery between 37-38wks and antenatal testing can begin now with NSTs in the office. However the patient would like delivery at 39 weeks if that is an option. Discussed if fetal testing is normal, growth is normal, it would be an option.  Last growth was normal on 7/28 - 14%ile (prior was 12%ile)  Preterm labor symptoms and general obstetric precautions  including but not limited to vaginal bleeding, contractions, leaking of fluid and fetal movement were reviewed in detail with the patient. Please refer to After Visit Summary for other counseling recommendations.   Return in about 2 weeks (around 10/02/2021) for OB VISIT, MD or APP with NST.  Future Appointments  Date Time Provider Department Center  10/03/2021  7:15 AM [facility]  10/03/2021  7:30 AM [facility] US2 [facility]    Milas Hock, MD

## 2021-09-18 ENCOUNTER — Encounter: Payer: Self-pay | Admitting: Obstetrics and Gynecology

## 2021-09-18 ENCOUNTER — Ambulatory Visit (INDEPENDENT_AMBULATORY_CARE_PROVIDER_SITE_OTHER): Payer: BC Managed Care – PPO | Admitting: Obstetrics and Gynecology

## 2021-09-18 VITALS — BP 111/70 | HR 92 | Wt 159.0 lb

## 2021-09-18 DIAGNOSIS — Z3480 Encounter for supervision of other normal pregnancy, unspecified trimester: Secondary | ICD-10-CM

## 2021-09-18 DIAGNOSIS — R772 Abnormality of alphafetoprotein: Secondary | ICD-10-CM

## 2021-09-23 ENCOUNTER — Ambulatory Visit (INDEPENDENT_AMBULATORY_CARE_PROVIDER_SITE_OTHER): Payer: BC Managed Care – PPO | Admitting: *Deleted

## 2021-09-23 DIAGNOSIS — R772 Abnormality of alphafetoprotein: Secondary | ICD-10-CM

## 2021-09-23 DIAGNOSIS — Z3480 Encounter for supervision of other normal pregnancy, unspecified trimester: Secondary | ICD-10-CM

## 2021-09-23 NOTE — Progress Notes (Cosign Needed)
NST reactive and sent to J Rasch,NP for review

## 2021-10-03 ENCOUNTER — Other Ambulatory Visit: Payer: Self-pay | Admitting: Obstetrics and Gynecology

## 2021-10-03 ENCOUNTER — Ambulatory Visit: Payer: BC Managed Care – PPO | Admitting: *Deleted

## 2021-10-03 ENCOUNTER — Ambulatory Visit: Payer: BC Managed Care – PPO | Attending: Obstetrics

## 2021-10-03 VITALS — BP 120/60 | HR 73

## 2021-10-03 DIAGNOSIS — Z3A35 35 weeks gestation of pregnancy: Secondary | ICD-10-CM | POA: Diagnosis not present

## 2021-10-03 DIAGNOSIS — R772 Abnormality of alphafetoprotein: Secondary | ICD-10-CM

## 2021-10-03 DIAGNOSIS — Z3689 Encounter for other specified antenatal screening: Secondary | ICD-10-CM | POA: Insufficient documentation

## 2021-10-03 DIAGNOSIS — O4593 Premature separation of placenta, unspecified, third trimester: Secondary | ICD-10-CM | POA: Diagnosis not present

## 2021-10-03 DIAGNOSIS — O280 Abnormal hematological finding on antenatal screening of mother: Secondary | ICD-10-CM | POA: Diagnosis not present

## 2021-10-03 NOTE — Procedures (Signed)
Christy Durham [birth year] [redacted]w[redacted]d  Fetus A Non-Stress Test Interpretation for 10/03/21  Indication: Unsatisfactory BPP  Fetal Heart Rate A Mode: External Baseline Rate (A): 135 bpm Variability: Moderate Accelerations: 15 x 15 Decelerations: None Multiple birth?: No  Uterine Activity Mode: Toco Contraction Frequency (min): 2 u/c's in 20 min Contraction Duration (sec): 60-80 Contraction Quality: Mild (pt says, " my belly just gets hard for a minute") Resting Tone Palpated: Relaxed Resting Time: Adequate  Interpretation (Fetal Testing) Nonstress Test Interpretation: Reactive Overall Impression: Reassuring for gestational age Comments: tracing reviewed byDr. Judeth Cornfield

## 2021-10-10 ENCOUNTER — Other Ambulatory Visit (HOSPITAL_COMMUNITY)
Admission: RE | Admit: 2021-10-10 | Discharge: 2021-10-10 | Disposition: A | Discharge status: Home / Self Care | Payer: BC Managed Care – PPO | Source: Ambulatory Visit | Attending: Obstetrics and Gynecology | Admitting: Obstetrics and Gynecology

## 2021-10-10 ENCOUNTER — Ambulatory Visit (INDEPENDENT_AMBULATORY_CARE_PROVIDER_SITE_OTHER): Payer: BC Managed Care – PPO | Admitting: *Deleted

## 2021-10-10 ENCOUNTER — Ambulatory Visit (INDEPENDENT_AMBULATORY_CARE_PROVIDER_SITE_OTHER): Payer: BC Managed Care – PPO | Admitting: Obstetrics and Gynecology

## 2021-10-10 VITALS — BP 117/59 | HR 80 | Wt 162.0 lb

## 2021-10-10 DIAGNOSIS — Z3480 Encounter for supervision of other normal pregnancy, unspecified trimester: Secondary | ICD-10-CM

## 2021-10-10 DIAGNOSIS — Z23 Encounter for immunization: Secondary | ICD-10-CM | POA: Diagnosis not present

## 2021-10-10 NOTE — Progress Notes (Signed)
   PRENATAL VISIT NOTE  Subjective:  Christy Durham is a 31 y.o. G2P1001 at [redacted]w[redacted]d being seen today for ongoing prenatal care.  She is currently monitored for the following issues for this high-risk pregnancy and has Supervision of other normal pregnancy, antepartum and Elevated AFP on their problem list.  Patient reports no complaints.  Contractions: Not present. Vag. Bleeding: None.  Movement: Present. Denies leaking of fluid.   The following portions of the patient's history were reviewed and updated as appropriate: allergies, current medications, past family history, past medical history, past social history, past surgical history and problem list.   Objective:   Vitals:   10/10/21 1044  BP: (!) 117/59  Pulse: 80  Weight: 162 lb (73.5 kg)    Fetal Status:     Movement: Present     General:  Alert, oriented and cooperative. Patient is in no acute distress.  Skin: Skin is warm and dry. No rash noted.   Cardiovascular: Normal heart rate noted  Respiratory: Normal respiratory effort, no problems with respiration noted  Abdomen: Soft, gravid, appropriate for gestational age.  Pain/Pressure: Absent     Pelvic: Cervical exam deferred        Extremities: Normal range of motion.  Edema: None  Mental Status: Normal mood and affect. Normal behavior. Normal judgment and thought content.   Assessment and Plan:  Pregnancy: G2P1001 at [redacted]w[redacted]d 1. Supervision of other normal pregnancy, antepartum  - Culture, beta strep (group b only) - Cervicovaginal ancillary only( [place] - NST reactive, baseline 125 bmp, + accels, no decels.  - Baby is breech, MFM is ok with 39w delivery. Will send her information over for C/S @ 39 weeks. If she is scanned next week and baby is vertex we can cancel. All of this was discussed with patient.   Preterm labor symptoms and general obstetric precautions including but not limited to vaginal bleeding, contractions, leaking of fluid and fetal movement were reviewed  in detail with the patient. Please refer to After Visit Summary for other counseling recommendations.   No follow-ups on file.  Future Appointments  Date Time Provider Department Center  10/16/2021  3:30 PM [facility] NURSE [facility]  10/16/2021  4:10 PM Milas Hock, MD [facility]  10/24/2021  8:30 AM [facility]URSE [facility]  10/24/2021  9:10 AM Gordan Grell, Harolyn Rutherford, NP [facility]    Venia Carbon, NP

## 2021-10-13 LAB — CULTURE, BETA STREP (GROUP B ONLY)
MICRO NUMBER:: 13864465
SPECIMEN QUALITY:: ADEQUATE

## 2021-10-15 LAB — CERVICOVAGINAL ANCILLARY ONLY
Chlamydia: NEGATIVE
Comment: NEGATIVE
Comment: NORMAL
Neisseria Gonorrhea: NEGATIVE

## 2021-10-16 ENCOUNTER — Ambulatory Visit (INDEPENDENT_AMBULATORY_CARE_PROVIDER_SITE_OTHER): Payer: BC Managed Care – PPO | Admitting: *Deleted

## 2021-10-16 ENCOUNTER — Ambulatory Visit (INDEPENDENT_AMBULATORY_CARE_PROVIDER_SITE_OTHER): Payer: BC Managed Care – PPO | Admitting: Obstetrics and Gynecology

## 2021-10-16 VITALS — BP 122/61 | HR 85 | Wt 167.0 lb

## 2021-10-16 DIAGNOSIS — Z3480 Encounter for supervision of other normal pregnancy, unspecified trimester: Secondary | ICD-10-CM

## 2021-10-16 DIAGNOSIS — R772 Abnormality of alphafetoprotein: Secondary | ICD-10-CM

## 2021-10-16 DIAGNOSIS — Z3A37 37 weeks gestation of pregnancy: Secondary | ICD-10-CM

## 2021-10-16 NOTE — Progress Notes (Signed)
   PRENATAL VISIT NOTE  Subjective:  Christy Durham is a 31 y.o. G2P1001 at [redacted]w[redacted]d being seen today for ongoing prenatal care.  She is currently monitored for the following issues for this low-risk pregnancy and has Supervision of other normal pregnancy, antepartum and Elevated AFP on their problem list.  Patient reports no complaints.   .  .   . Denies leaking of fluid.   The following portions of the patient's history were reviewed and updated as appropriate: allergies, current medications, past family history, past medical history, past social history, past surgical history and problem list.   Objective:   Vitals:   10/16/21 1541  BP: 122/61  Pulse: 85  Weight: 167 lb (75.8 kg)    Fetal Status:           General:  Alert, oriented and cooperative. Patient is in no acute distress.  Skin: Skin is warm and dry. No rash noted.   Cardiovascular: Normal heart rate noted  Respiratory: Normal respiratory effort, no problems with respiration noted  Abdomen: Soft, gravid, appropriate for gestational age.        Pelvic: Cervical exam deferred        Extremities: Normal range of motion.     Mental Status: Normal mood and affect. Normal behavior. Normal judgment and thought content.  NST: 130, mod variability, + accels, no decels  Toco: irregular   Assessment and Plan:  Pregnancy: G2P1001 at [redacted]w[redacted]d 1. Supervision of other normal pregnancy, antepartum Discussed ECV vs not prior to her c-section. She definitely does not want to do it prior the day of her procedure. She would like an epidural prior to the version. Reviewed if successful, she would stay for IOL an dif not, she would have c-section with BTL as planned.  L&D updated, will let Dr. Adrian Blackwater know as well.   2. Elevated AFP Normal growth, normal NST. Continue weekly until delivery.   Preterm labor symptoms and general obstetric precautions including but not limited to vaginal bleeding, contractions, leaking of fluid and fetal movement  were reviewed in detail with the patient. Please refer to After Visit Summary for other counseling recommendations.   No follow-ups on file.  Future Appointments  Date Time Provider Department Center  10/24/2021  8:30 AM [facility] NURSE [facility]  10/24/2021  9:10 AM Rasch, Harolyn Rutherford, NP [facility]  10/27/2021  9:30 AM [facility] PAT 1 [facility] None  10/28/2021  8:00 AM [facility] SCHED ROOM [facility] None    Milas Hock, MD

## 2021-10-16 NOTE — Progress Notes (Cosign Needed)
NST reactive and reviewed by Dr Para March

## 2021-10-20 NOTE — Patient Instructions (Signed)
Kassandra Meriweather  10/20/2021   Your procedure is scheduled on:  10/28/2021  Arrive at 0730 at Entrance C on [address] at [facility]  and [address]. You are invited to use the FREE valet parking or use the Visitor's parking deck.  Pick up the phone at the desk and dial [phone].  Call this number if you have problems the morning of surgery: [phone]  Remember:   Do not eat food:(After Midnight) Desps de medianoche.  Do not drink clear liquids: (After Midnight) Desps de medianoche.  Take these medicines the morning of surgery with A SIP OF WATER:  none   Do not wear jewelry, make-up or nail polish.  Do not wear lotions, powders, or perfumes. Do not wear deodorant.  Do not shave 48 hours prior to surgery.  Do not bring valuables to the hospital.  [facility] is not   responsible for any belongings or valuables brought to the hospital.  Contacts, dentures or bridgework may not be worn into surgery.  Leave suitcase in the car. After surgery it may be brought to your room.  For patients admitted to the hospital, checkout time is 11:00 AM the day of              discharge.      Please read over the following fact sheets that you were given:     Preparing for Surgery

## 2021-10-21 ENCOUNTER — Encounter (HOSPITAL_COMMUNITY): Payer: Self-pay | Admitting: *Deleted

## 2021-10-21 ENCOUNTER — Telehealth (HOSPITAL_COMMUNITY): Payer: Self-pay | Admitting: *Deleted

## 2021-10-21 NOTE — Telephone Encounter (Signed)
Preadmission screen  

## 2021-10-22 ENCOUNTER — Other Ambulatory Visit: Payer: Self-pay | Admitting: Advanced Practice Midwife

## 2021-10-22 DIAGNOSIS — O321XX0 Maternal care for breech presentation, not applicable or unspecified: Secondary | ICD-10-CM

## 2021-10-24 ENCOUNTER — Ambulatory Visit (INDEPENDENT_AMBULATORY_CARE_PROVIDER_SITE_OTHER): Payer: BC Managed Care – PPO | Admitting: Advanced Practice Midwife

## 2021-10-24 ENCOUNTER — Ambulatory Visit (INDEPENDENT_AMBULATORY_CARE_PROVIDER_SITE_OTHER): Payer: BC Managed Care – PPO

## 2021-10-24 VITALS — BP 116/65 | HR 80 | Wt 165.0 lb

## 2021-10-24 DIAGNOSIS — R772 Abnormality of alphafetoprotein: Secondary | ICD-10-CM

## 2021-10-24 DIAGNOSIS — Z3A38 38 weeks gestation of pregnancy: Secondary | ICD-10-CM

## 2021-10-24 DIAGNOSIS — Z3480 Encounter for supervision of other normal pregnancy, unspecified trimester: Secondary | ICD-10-CM

## 2021-10-24 DIAGNOSIS — O321XX0 Maternal care for breech presentation, not applicable or unspecified: Secondary | ICD-10-CM

## 2021-10-24 NOTE — Progress Notes (Signed)
Pt her for NST. NST reactive and reviewed by Sharen Counter, CNM.

## 2021-10-24 NOTE — Patient Instructions (Addendum)
Tips to Turn a Breech Baby at Home: Do not continue any of these tips if you have pain or discomfort. 1.  Lie on your back with your hips on 3 or more pillows (raised). Do this for 10 minutes 2-3 times per day. 2.  Try the [employer], especially position 1. ([employer]). 3.  Try placeing an ice pack or frozen vegetable bag on your upper abdomen where the baby's head is located.  Use a cloth to protect your skin. 4.  Play soft music or have a family member talk softly at your feet for a few minutes.   5.  You can also try chiropractic or acupuncture/acupressure techniques by a trained professional.  **A good resource for turning babies is a website/organization called [employer]. Here is their info on breech babies: https://www.spinningbab[employer].com/pregnancy-birth/baby-position/breech/

## 2021-10-24 NOTE — Progress Notes (Signed)
   PRENATAL VISIT NOTE  Subjective:  Christy Durham is a 31 y.o. G2P1001 at [redacted]w[redacted]d being seen today for ongoing prenatal care.  She is currently monitored for the following issues for this low-risk pregnancy and has Supervision of other normal pregnancy, antepartum and Elevated AFP on their problem list.  Patient reports occasional contractions.  Contractions: Not present. Vag. Bleeding: None.  Movement: Present. Denies leaking of fluid.   The following portions of the patient's history were reviewed and updated as appropriate: allergies, current medications, past family history, past medical history, past social history, past surgical history and problem list.   Objective:   Vitals:   10/24/21 0836  BP: 116/65  Pulse: 80    Fetal Status:     Movement: Present     General:  Alert, oriented and cooperative. Patient is in no acute distress.  Skin: Skin is warm and dry. No rash noted.   Cardiovascular: Normal heart rate noted  Respiratory: Normal respiratory effort, no problems with respiration noted  Abdomen: Soft, gravid, appropriate for gestational age.  Pain/Pressure: Absent     Pelvic: Cervical exam deferred        Extremities: Normal range of motion.  Edema: None  Mental Status: Normal mood and affect. Normal behavior. Normal judgment and thought content.   Assessment and Plan:  Pregnancy: G2P1001 at [redacted]w[redacted]d 1. Elevated AFP   2. Supervision of other normal pregnancy, antepartum --Anticipatory guidance about next visits/weeks of pregnancy given.   3. [redacted] weeks gestation of pregnancy    4. Breech presentation, single or unspecified fetus --ECV with IOL or C/S to follow scheduled 10/28/21, at 39 weeks --Reviewed safe things to try to turn breech baby including maternal positions, using cold on abdomen, sound/light, swimming, and/or chiropractic/accupuncture.  Also discussed risks/benefits of external version.  Recommend primary C/S if onset of labor and fetus remains in breech  position.  Pt states understanding.    Term labor symptoms and general obstetric precautions including but not limited to vaginal bleeding, contractions, leaking of fluid and fetal movement were reviewed in detail with the patient. Please refer to After Visit Summary for other counseling recommendations.   No follow-ups on file.  Future Appointments  Date Time Provider Department Center  10/24/2021  9:10 AM Hurshel Party, CNM [facility]  10/28/2021  8:00 AM [facility] SCHED ROOM [facility] None    Sharen Counter, CNM

## 2021-10-27 ENCOUNTER — Encounter: Payer: Self-pay | Admitting: Obstetrics and Gynecology

## 2021-10-27 ENCOUNTER — Other Ambulatory Visit (HOSPITAL_COMMUNITY)
Admission: RE | Admit: 2021-10-27 | Discharge: 2021-10-27 | Disposition: A | Discharge status: Home / Self Care | Payer: BC Managed Care – PPO | Source: Ambulatory Visit | Attending: Obstetrics and Gynecology | Admitting: Obstetrics and Gynecology

## 2021-10-27 ENCOUNTER — Other Ambulatory Visit: Payer: Self-pay

## 2021-10-27 ENCOUNTER — Encounter (HOSPITAL_COMMUNITY): Payer: Self-pay | Admitting: Obstetrics & Gynecology

## 2021-10-27 ENCOUNTER — Ambulatory Visit (INDEPENDENT_AMBULATORY_CARE_PROVIDER_SITE_OTHER): Payer: BC Managed Care – PPO | Admitting: Obstetrics & Gynecology

## 2021-10-27 ENCOUNTER — Inpatient Hospital Stay (HOSPITAL_COMMUNITY): Payer: BC Managed Care – PPO | Admitting: Anesthesiology

## 2021-10-27 ENCOUNTER — Encounter (HOSPITAL_COMMUNITY)
Admission: AD | Disposition: A | Discharge status: Home / Self Care | Payer: Self-pay | Source: Home / Self Care | Attending: Obstetrics and Gynecology

## 2021-10-27 ENCOUNTER — Inpatient Hospital Stay (HOSPITAL_COMMUNITY)
Admission: AD | Admit: 2021-10-27 | Discharge: 2021-10-29 | DRG: 785 | Disposition: A | Discharge status: Home / Self Care | Payer: BC Managed Care – PPO | Attending: Obstetrics and Gynecology | Admitting: Obstetrics and Gynecology

## 2021-10-27 VITALS — BP 120/70 | HR 93 | Wt 164.0 lb

## 2021-10-27 DIAGNOSIS — R772 Abnormality of alphafetoprotein: Principal | ICD-10-CM

## 2021-10-27 DIAGNOSIS — Z3A38 38 weeks gestation of pregnancy: Secondary | ICD-10-CM | POA: Diagnosis not present

## 2021-10-27 DIAGNOSIS — Z302 Encounter for sterilization: Secondary | ICD-10-CM

## 2021-10-27 DIAGNOSIS — O321XX0 Maternal care for breech presentation, not applicable or unspecified: Secondary | ICD-10-CM | POA: Diagnosis not present

## 2021-10-27 DIAGNOSIS — O9902 Anemia complicating childbirth: Secondary | ICD-10-CM | POA: Diagnosis present

## 2021-10-27 DIAGNOSIS — O4202 Full-term premature rupture of membranes, onset of labor within 24 hours of rupture: Secondary | ICD-10-CM | POA: Diagnosis not present

## 2021-10-27 DIAGNOSIS — O4292 Full-term premature rupture of membranes, unspecified as to length of time between rupture and onset of labor: Secondary | ICD-10-CM | POA: Diagnosis present

## 2021-10-27 DIAGNOSIS — Z3480 Encounter for supervision of other normal pregnancy, unspecified trimester: Secondary | ICD-10-CM

## 2021-10-27 LAB — CBC
HCT: 38.6 % (ref 36.0–46.0)
Hemoglobin: 13.3 g/dL (ref 12.0–15.0)
MCH: 34.1 pg — ABNORMAL HIGH (ref 26.0–34.0)
MCHC: 34.5 g/dL (ref 30.0–36.0)
MCV: 99 fL (ref 80.0–100.0)
Platelets: 210 10*3/uL (ref 150–400)
RBC: 3.9 MIL/uL (ref 3.87–5.11)
RDW: 13.1 % (ref 11.5–15.5)
WBC: 10.5 10*3/uL (ref 4.0–10.5)
nRBC: 0 % (ref 0.0–0.2)

## 2021-10-27 LAB — OB RESULTS CONSOLE GBS: GBS: NEGATIVE

## 2021-10-27 LAB — TYPE AND SCREEN
ABO/RH(D): O POS
Antibody Screen: NEGATIVE

## 2021-10-27 LAB — RPR: RPR Ser Ql: NONREACTIVE

## 2021-10-27 SURGERY — Surgical Case
Anesthesia: Spinal

## 2021-10-27 MED ORDER — MEDROXYPROGESTERONE ACETATE 150 MG/ML IM SUSP: 150.0000 mg | INTRAMUSCULAR | Status: DC | PRN

## 2021-10-27 MED ORDER — OXYCODONE HCL 5 MG PO TABS: 5.0000 mg | ORAL_TABLET | ORAL | Status: DC | PRN

## 2021-10-27 MED ORDER — MENTHOL 3 MG MT LOZG: 1.0000 | LOZENGE | OROMUCOSAL | Status: DC | PRN

## 2021-10-27 MED ORDER — NALOXONE HCL 0.4 MG/ML IJ SOLN: 0.4000 mg | INTRAMUSCULAR | Status: DC | PRN

## 2021-10-27 MED ORDER — SOD CITRATE-CITRIC ACID 500-334 MG/5ML PO SOLN
30.0000 mL | Freq: Once | ORAL | Status: AC
  Administered 2021-10-27: 30 mL via ORAL
  Filled 2021-10-27: qty 30

## 2021-10-27 MED ORDER — LACTATED RINGERS IV SOLN: INTRAVENOUS | Status: DC

## 2021-10-27 MED ORDER — PROMETHAZINE HCL 25 MG/ML IJ SOLN: 6.2500 mg | INTRAMUSCULAR | Status: DC | PRN

## 2021-10-27 MED ORDER — IBUPROFEN 600 MG PO TABS
600.0000 mg | ORAL_TABLET | Freq: Four times a day (QID) | ORAL | Status: DC
  Administered 2021-10-28 – 2021-10-29 (×5): 600 mg via ORAL
  Filled 2021-10-27 (×5): qty 1

## 2021-10-27 MED ORDER — FENTANYL CITRATE (PF) 100 MCG/2ML IJ SOLN
INTRAMUSCULAR | Status: AC
  Filled 2021-10-27: qty 2

## 2021-10-27 MED ORDER — KETOROLAC TROMETHAMINE 30 MG/ML IJ SOLN
30.0000 mg | Freq: Four times a day (QID) | INTRAMUSCULAR | Status: AC
  Administered 2021-10-27 – 2021-10-28 (×2): 30 mg via INTRAVENOUS
  Filled 2021-10-27 (×2): qty 1

## 2021-10-27 MED ORDER — SIMETHICONE 80 MG PO CHEW: 80.0000 mg | CHEWABLE_TABLET | ORAL | Status: DC | PRN

## 2021-10-27 MED ORDER — LACTATED RINGERS IV BOLUS
1000.0000 mL | Freq: Once | INTRAVENOUS | Status: AC
  Administered 2021-10-27: 1000 mL via INTRAVENOUS

## 2021-10-27 MED ORDER — DIPHENHYDRAMINE HCL 50 MG/ML IJ SOLN: 12.5000 mg | INTRAMUSCULAR | Status: DC | PRN

## 2021-10-27 MED ORDER — WITCH HAZEL-GLYCERIN EX PADS: 1.0000 | MEDICATED_PAD | CUTANEOUS | Status: DC | PRN

## 2021-10-27 MED ORDER — SODIUM CHLORIDE 0.9 % IV SOLN
500.0000 mg | Freq: Once | INTRAVENOUS | Status: AC
  Administered 2021-10-27: 500 mg via INTRAVENOUS
  Filled 2021-10-27: qty 5

## 2021-10-27 MED ORDER — MEPERIDINE HCL 25 MG/ML IJ SOLN: 6.2500 mg | INTRAMUSCULAR | Status: DC | PRN

## 2021-10-27 MED ORDER — CEFAZOLIN SODIUM-DEXTROSE 2-4 GM/100ML-% IV SOLN
2.0000 g | INTRAVENOUS | Status: AC
  Administered 2021-10-27: 2 g via INTRAVENOUS
  Filled 2021-10-27: qty 100

## 2021-10-27 MED ORDER — ONDANSETRON HCL 4 MG/2ML IJ SOLN
INTRAMUSCULAR | Status: AC
  Filled 2021-10-27: qty 2

## 2021-10-27 MED ORDER — COCONUT OIL OIL
1.0000 | TOPICAL_OIL | Status: DC | PRN
  Administered 2021-10-29: 1 via TOPICAL

## 2021-10-27 MED ORDER — FENTANYL CITRATE (PF) 100 MCG/2ML IJ SOLN
INTRAMUSCULAR | Status: DC | PRN
  Administered 2021-10-27: 15 ug via INTRAVENOUS

## 2021-10-27 MED ORDER — ENOXAPARIN SODIUM 40 MG/0.4ML IJ SOSY
40.0000 mg | PREFILLED_SYRINGE | INTRAMUSCULAR | Status: DC
  Administered 2021-10-28 – 2021-10-29 (×2): 40 mg via SUBCUTANEOUS
  Filled 2021-10-27 (×2): qty 0.4

## 2021-10-27 MED ORDER — SIMETHICONE 80 MG PO CHEW
80.0000 mg | CHEWABLE_TABLET | Freq: Three times a day (TID) | ORAL | Status: DC
  Administered 2021-10-28 – 2021-10-29 (×5): 80 mg via ORAL
  Filled 2021-10-27 (×5): qty 1

## 2021-10-27 MED ORDER — FENTANYL CITRATE (PF) 100 MCG/2ML IJ SOLN: 25.0000 ug | INTRAMUSCULAR | Status: DC | PRN

## 2021-10-27 MED ORDER — DEXAMETHASONE SODIUM PHOSPHATE 4 MG/ML IJ SOLN
INTRAMUSCULAR | Status: DC | PRN
  Administered 2021-10-27: 4 mg via INTRAVENOUS

## 2021-10-27 MED ORDER — NALOXONE HCL 4 MG/10ML IJ SOLN: 1.0000 ug/kg/h | INTRAVENOUS | Status: DC | PRN

## 2021-10-27 MED ORDER — DIPHENHYDRAMINE HCL 25 MG PO CAPS: 25.0000 mg | ORAL_CAPSULE | ORAL | Status: DC | PRN

## 2021-10-27 MED ORDER — STERILE WATER FOR IRRIGATION IR SOLN
Status: DC | PRN
  Administered 2021-10-27: 1000 mL

## 2021-10-27 MED ORDER — MORPHINE SULFATE (PF) 0.5 MG/ML IJ SOLN
INTRAMUSCULAR | Status: AC
  Filled 2021-10-27: qty 10

## 2021-10-27 MED ORDER — MAGNESIUM HYDROXIDE 400 MG/5ML PO SUSP: 30.0000 mL | ORAL | Status: DC | PRN

## 2021-10-27 MED ORDER — OXYTOCIN-SODIUM CHLORIDE 30-0.9 UT/500ML-% IV SOLN
INTRAVENOUS | Status: DC | PRN
  Administered 2021-10-27: 300 mL via INTRAVENOUS

## 2021-10-27 MED ORDER — DEXAMETHASONE SODIUM PHOSPHATE 4 MG/ML IJ SOLN
INTRAMUSCULAR | Status: AC
  Filled 2021-10-27: qty 1

## 2021-10-27 MED ORDER — ONDANSETRON HCL 4 MG/2ML IJ SOLN: 4.0000 mg | Freq: Three times a day (TID) | INTRAMUSCULAR | Status: DC | PRN

## 2021-10-27 MED ORDER — DIPHENHYDRAMINE HCL 25 MG PO CAPS: 25.0000 mg | ORAL_CAPSULE | Freq: Four times a day (QID) | ORAL | Status: DC | PRN

## 2021-10-27 MED ORDER — ACETAMINOPHEN 500 MG PO TABS
1000.0000 mg | ORAL_TABLET | Freq: Once | ORAL | Status: AC
  Administered 2021-10-27: 1000 mg via ORAL
  Filled 2021-10-27: qty 2

## 2021-10-27 MED ORDER — PRENATAL MULTIVITAMIN CH
1.0000 | ORAL_TABLET | Freq: Every day | ORAL | Status: DC
  Administered 2021-10-28 – 2021-10-29 (×2): 1 via ORAL
  Filled 2021-10-27 (×2): qty 1

## 2021-10-27 MED ORDER — PHENYLEPHRINE 80 MCG/ML (10ML) SYRINGE FOR IV PUSH (FOR BLOOD PRESSURE SUPPORT)
PREFILLED_SYRINGE | INTRAVENOUS | Status: AC
  Filled 2021-10-27: qty 20

## 2021-10-27 MED ORDER — SENNOSIDES-DOCUSATE SODIUM 8.6-50 MG PO TABS
2.0000 | ORAL_TABLET | Freq: Every day | ORAL | Status: DC
  Administered 2021-10-28 – 2021-10-29 (×2): 2 via ORAL
  Filled 2021-10-27 (×2): qty 2

## 2021-10-27 MED ORDER — OXYTOCIN-SODIUM CHLORIDE 30-0.9 UT/500ML-% IV SOLN: 2.5000 [IU]/h | INTRAVENOUS | Status: AC

## 2021-10-27 MED ORDER — SODIUM CHLORIDE 0.9 % IR SOLN
Status: DC | PRN
  Administered 2021-10-27: 1

## 2021-10-27 MED ORDER — PHENYLEPHRINE HCL-NACL 20-0.9 MG/250ML-% IV SOLN
INTRAVENOUS | Status: AC
  Filled 2021-10-27: qty 250

## 2021-10-27 MED ORDER — SCOPOLAMINE 1 MG/3DAYS TD PT72
1.0000 | MEDICATED_PATCH | TRANSDERMAL | Status: DC
  Administered 2021-10-27: 1.5 mg via TRANSDERMAL
  Filled 2021-10-27: qty 1

## 2021-10-27 MED ORDER — GABAPENTIN 100 MG PO CAPS
200.0000 mg | ORAL_CAPSULE | Freq: Every day | ORAL | Status: DC
  Administered 2021-10-27 – 2021-10-29 (×2): 200 mg via ORAL
  Filled 2021-10-27 (×2): qty 2

## 2021-10-27 MED ORDER — ONDANSETRON HCL 4 MG/2ML IJ SOLN
INTRAMUSCULAR | Status: DC | PRN
  Administered 2021-10-27: 4 mg via INTRAVENOUS

## 2021-10-27 MED ORDER — PHENYLEPHRINE HCL (PRESSORS) 10 MG/ML IV SOLN
INTRAVENOUS | Status: DC | PRN
  Administered 2021-10-27: 80 ug via INTRAVENOUS
  Administered 2021-10-27: 160 ug via INTRAVENOUS

## 2021-10-27 MED ORDER — OXYTOCIN-SODIUM CHLORIDE 30-0.9 UT/500ML-% IV SOLN
INTRAVENOUS | Status: AC
  Filled 2021-10-27: qty 1000

## 2021-10-27 MED ORDER — ACETAMINOPHEN 500 MG PO TABS
1000.0000 mg | ORAL_TABLET | Freq: Four times a day (QID) | ORAL | Status: DC
  Administered 2021-10-27 – 2021-10-29 (×6): 1000 mg via ORAL
  Filled 2021-10-27 (×7): qty 2

## 2021-10-27 MED ORDER — SODIUM CHLORIDE 0.9% FLUSH: 3.0000 mL | INTRAVENOUS | Status: DC | PRN

## 2021-10-27 MED ORDER — ZOLPIDEM TARTRATE 5 MG PO TABS: 5.0000 mg | ORAL_TABLET | Freq: Every evening | ORAL | Status: DC | PRN

## 2021-10-27 MED ORDER — PHENYLEPHRINE HCL-NACL 20-0.9 MG/250ML-% IV SOLN
INTRAVENOUS | Status: DC | PRN
  Administered 2021-10-27: 60 ug/min via INTRAVENOUS

## 2021-10-27 MED ORDER — AMISULPRIDE (ANTIEMETIC) 5 MG/2ML IV SOLN: 10.0000 mg | Freq: Once | INTRAVENOUS | Status: DC | PRN

## 2021-10-27 MED ORDER — DIBUCAINE (PERIANAL) 1 % EX OINT: 1.0000 | TOPICAL_OINTMENT | CUTANEOUS | Status: DC | PRN

## 2021-10-27 MED ORDER — MORPHINE SULFATE (PF) 0.5 MG/ML IJ SOLN
INTRAMUSCULAR | Status: DC | PRN
  Administered 2021-10-27: 150 ug via INTRATHECAL

## 2021-10-27 MED ORDER — BUPIVACAINE IN DEXTROSE 0.75-8.25 % IT SOLN
INTRATHECAL | Status: DC | PRN
  Administered 2021-10-27: 1.6 mL via INTRATHECAL

## 2021-10-27 SURGICAL SUPPLY — 37 items
BENZOIN TINCTURE PRP APPL 2/3 (GAUZE/BANDAGES/DRESSINGS) IMPLANT
CHLORAPREP W/TINT 26ML (MISCELLANEOUS) ×2 IMPLANT
CLAMP UMBILICAL CORD (MISCELLANEOUS) ×1 IMPLANT
CLOTH BEACON ORANGE TIMEOUT ST (SAFETY) ×1 IMPLANT
DRSG OPSITE POSTOP 4X10 (GAUZE/BANDAGES/DRESSINGS) ×1 IMPLANT
ELECT REM PT RETURN 9FT ADLT (ELECTROSURGICAL) ×1
ELECTRODE REM PT RTRN 9FT ADLT (ELECTROSURGICAL) ×1 IMPLANT
EXTRACTOR VACUUM BELL STYLE (SUCTIONS) IMPLANT
GAUZE SPONGE 4X4 12PLY STRL LF (GAUZE/BANDAGES/DRESSINGS) IMPLANT
GLOVE BIOGEL PI IND STRL 7.0 (GLOVE) ×2 IMPLANT
GLOVE ECLIPSE 7.0 STRL STRAW (GLOVE) ×1 IMPLANT
GOWN STRL REUS W/TWL LRG LVL3 (GOWN DISPOSABLE) ×2 IMPLANT
HEMOSTAT ARISTA ABSORB 3G PWDR (HEMOSTASIS) IMPLANT
KIT ABG SYR 3ML LUER SLIP (SYRINGE) ×1 IMPLANT
LIGASURE IMPACT 36 18CM CVD LR (INSTRUMENTS) IMPLANT
NDL HYPO 25X5/8 SAFETYGLIDE (NEEDLE) ×1 IMPLANT
NEEDLE HYPO 25X5/8 SAFETYGLIDE (NEEDLE) ×1 IMPLANT
NS IRRIG 1000ML POUR BTL (IV SOLUTION) ×1 IMPLANT
PACK C SECTION WH (CUSTOM PROCEDURE TRAY) ×1 IMPLANT
PAD ABD 7.5X8 STRL (GAUZE/BANDAGES/DRESSINGS) IMPLANT
PAD OB MATERNITY 4.3X12.25 (PERSONAL CARE ITEMS) ×1 IMPLANT
RTRCTR C-SECT PINK 25CM LRG (MISCELLANEOUS) ×1 IMPLANT
STRIP CLOSURE SKIN 1/2X4 (GAUZE/BANDAGES/DRESSINGS) IMPLANT
SUT MNCRL 0 VIOLET CTX 36 (SUTURE) ×2 IMPLANT
SUT MONOCRYL 0 CTX 36 (SUTURE) ×2
SUT PLAIN 0 NONE (SUTURE) IMPLANT
SUT PLAIN 2 0 (SUTURE)
SUT PLAIN 2 0 XLH (SUTURE) IMPLANT
SUT PLAIN ABS 2-0 CT1 27XMFL (SUTURE) IMPLANT
SUT VIC AB 0 CTX 36 (SUTURE) ×1
SUT VIC AB 0 CTX36XBRD ANBCTRL (SUTURE) ×1 IMPLANT
SUT VIC AB 2-0 CT1 27 (SUTURE)
SUT VIC AB 2-0 CT1 TAPERPNT 27 (SUTURE) IMPLANT
SUT VIC AB 4-0 KS 27 (SUTURE) ×1 IMPLANT
TOWEL OR 17X24 6PK STRL BLUE (TOWEL DISPOSABLE) ×1 IMPLANT
TRAY FOLEY W/BAG SLVR 14FR LF (SET/KITS/TRAYS/PACK) IMPLANT
WATER STERILE IRR 1000ML POUR (IV SOLUTION) ×1 IMPLANT

## 2021-10-27 NOTE — Anesthesia Postprocedure Evaluation (Signed)
Anesthesia Post Note  Patient: Christy Durham  Procedure(s) Performed: CESAREAN SECTION WITH BILATERAL TUBAL LIGATION     Patient location during evaluation: PACU Anesthesia Type: Spinal Level of consciousness: oriented and awake and alert Pain management: pain level controlled Vital Signs Assessment: post-procedure vital signs reviewed and stable Respiratory status: spontaneous breathing, respiratory function stable and patient connected to nasal cannula oxygen Cardiovascular status: blood pressure returned to baseline and stable Postop Assessment: no headache, no backache and no apparent nausea or vomiting Anesthetic complications: no   No notable events documented.  Last Vitals:  Vitals:   10/27/21 1945 10/27/21 1950  BP:  (!) 97/54  Pulse: 76 67  Resp:  18  Temp:  36.4 C  SpO2: 99% 100%    Last Pain:  Vitals:   10/27/21 1950  TempSrc: Oral  PainSc: 2    Pain Goal:    LLE Motor Response: Purposeful movement (10/27/21 1935) LLE Sensation: Tingling (10/27/21 1935) RLE Motor Response: Purposeful movement (10/27/21 1935) RLE Sensation: Tingling (10/27/21 1935)     Epidural/Spinal Function Cutaneous sensation: Able to Wiggle Toes (10/27/21 1950), Patient able to flex knees: Yes (10/27/21 1950), Patient able to lift hips off bed: No (10/27/21 1950), Back pain beyond tenderness at insertion site: No (10/27/21 1950), Progressively worsening motor and/or sensory loss: No (10/27/21 1950), Bowel and/or bladder incontinence post epidural: No (10/27/21 1950)  [place]

## 2021-10-27 NOTE — Anesthesia Preprocedure Evaluation (Addendum)
Anesthesia Evaluation  Patient identified by MRN, date of birth, ID band Patient awake    Reviewed: Allergy & Precautions, NPO status , Patient's Chart, lab work & pertinent test results  History of Anesthesia Complications Negative for: history of anesthetic complications  Airway Mallampati: II  TM Distance: >3 FB Neck ROM: Full    Dental no notable dental hx. (+) Dental Advisory Given   Pulmonary neg pulmonary ROS,    Pulmonary exam normal        Cardiovascular negative cardio ROS Normal cardiovascular exam     Neuro/Psych negative neurological ROS     GI/Hepatic negative GI ROS, Neg liver ROS,   Endo/Other  negative endocrine ROS  Renal/GU negative Renal ROS     Musculoskeletal negative musculoskeletal ROS (+)   Abdominal   Peds  Hematology negative hematology ROS (+)   Anesthesia Other Findings   Reproductive/Obstetrics (+) Pregnancy Breech                            Anesthesia Physical Anesthesia Plan  ASA: 2  Anesthesia Plan: Spinal   Post-op Pain Management: Tylenol PO (pre-op)*   Induction:   PONV Risk Score and Plan: 2 and Ondansetron and Scopolamine patch - Pre-op  Airway Management Planned: Natural Airway  Additional Equipment:   Intra-op Plan:   Post-operative Plan:   Informed Consent: I have reviewed the patients History and Physical, chart, labs and discussed the procedure including the risks, benefits and alternatives for the proposed anesthesia with the patient or authorized representative who has indicated his/her understanding and acceptance.     Dental advisory given  Plan Discussed with: Anesthesiologist and CRNA  Anesthesia Plan Comments:        Anesthesia Quick Evaluation

## 2021-10-27 NOTE — MAU Provider Note (Signed)
Faculty Practice H&P  Christy Durham is a 31 y.o. female G2P1001 with IUP at [redacted]w[redacted]d presenting for cesarean section for breech and PROM. She was evaluated in the office earlier today. Pregnancy was been complicated by breech position.    Pt states she has been having no contractions, vaginal bleeding, intact membranes, with normal fetal movement.     Prenatal Course Source of Care: [facility] with onset of care at 10 weeks  Pregnancy complications or risks: Patient Active Problem List   Diagnosis Date Noted   Elevated AFP 06/10/2021   Supervision of other normal pregnancy, antepartum 04/07/2021   She desires bilateral tubal ligation for contraception.  She plans to breastfeed  Prenatal labs and studies: ABO, Rh: --/--/PENDING (09/18 1235) Antibody: PENDING (09/18 1235) Rubella: 1.07 (03/02 0859) RPR: NON-REACTIVE (06/23 0925)  HBsAg: NON-REACTIVE (03/02 0859)  HIV: NON-REACTIVE (06/23 0919)  GBS:    2hr Glucola: negative Genetic screening: normal Anatomy US: normal  Past Medical History: History reviewed. No pertinent past medical history.  Past Surgical History:  Past Surgical History:  Procedure Laterality Date   TONSILLECTOMY      Obstetrical History:  OB History     Gravida  2   Para  1   Term  1   Preterm      AB      Living  1      SAB      IAB      Ectopic      Multiple  0   Live Births  1           Gynecological History:  OB History     Gravida  2   Para  1   Term  1   Preterm      AB      Living  1      SAB      IAB      Ectopic      Multiple  0   Live Births  1           Social History:  Social History   Socioeconomic History   Marital status: Married    Spouse name: Not on file   Number of children: Not on file   Years of education: Not on file   Highest education level: Not on file  Occupational History   Occupation: Human resources officer    Employer: [employer]  Tobacco Use   Smoking status: Never    Smokeless tobacco: Never  Vaping Use   Vaping Use: Never used  Substance and Sexual Activity   Alcohol use: Not Currently   Drug use: Never   Sexual activity: Yes    Partners: Male  Other Topics Concern   Not on file  Social History Narrative   Not on file   Social Determinants of Health   Financial Resource Strain: Not on file  Food Insecurity: Not on file  Transportation Needs: Not on file  Physical Activity: Not on file  Stress: Not on file  Social Connections: Not on file    Family History:  Family History  Problem Relation Age of Onset   Cancer Mother 16       Brease   Heart defect Maternal Uncle     Medications:  Prenatal vitamins,  Current Facility-Administered Medications  Medication Dose Route Frequency Provider Last Rate Last Admin   [START ON 10/28/2021] ceFAZolin (ANCEF) IVPB 2g/100 mL premix  2 g Intravenous On Call to [place],  Jovoni Borkenhagen J, DO       lactated ringers infusion   Intravenous Continuous Madine Sarr J, DO        Allergies: No Known Allergies  Review of Systems: - negative  Physical Exam: Blood pressure 126/67, pulse 93, temperature 98.4 F (36.9 C), temperature source Oral, resp. rate 16, height 5' 5" (1.651 m), weight 74.3 kg, last menstrual period 01/28/2021, SpO2 98 %. GENERAL: Well-developed, well-nourished female in no acute distress.  LUNGS: Clear to auscultation bilaterally.  HEART: Regular rate and rhythm. ABDOMEN: Soft, nontender, nondistended, gravid. EFW 7 lbs EXTREMITIES: Nontender, no edema, 2+ distal pulses. FHT:  Baseline rate 130 bpm   Variability moderate  Accelerations present   Decelerations none Contractions: Every 8 mins   Pertinent Labs/Studies:   Lab Results  Component Value Date   WBC 9.8 08/01/2021   HGB 12.5 08/01/2021   HCT 36.6 08/01/2021   MCV 99.2 08/01/2021   PLT 227 08/01/2021    Assessment : Christy Durham is a 31 y.o. G2P1001 at [redacted]w[redacted]d being admitted for cesarean section secondary to breech and  PROM.  Plan: The risks of cesarean section discussed with the patient included but were not limited to: bleeding which may require transfusion or reoperation; infection which may require antibiotics; injury to bowel, bladder, ureters or other surrounding organs; injury to the fetus; need for additional procedures including hysterectomy in the event of a life-threatening hemorrhage; placental abnormalities wth subsequent pregnancies, incisional problems, thromboembolic phenomenon and other postoperative/anesthesia complications. The patient concurred with the proposed plan, giving informed written consent for the procedure.   Patient has been NPO since 6am and will remain NPO for procedure.  Preoperative prophylactic Ancef and Azithromycin ordered on call to the OR.    Chen Saadeh J, DO 10/27/2021, 12:53 PM      

## 2021-10-27 NOTE — MAU Note (Signed)
Christy Durham is a 31 y.o. at [redacted]w[redacted]d here in MAU reporting: confirmed SROM in the office today. SROM at 0715, clear fluid. No pain or bleeding.   Onset of complaint: today  Pain score: 0/10  Vitals:   10/27/21 1208  BP: 126/67  Pulse: 93  Resp: 16  Temp: 98.4 F (36.9 C)  SpO2: 98%     FHT:+FM, EFM applied in room  Lab orders placed from triage: none

## 2021-10-27 NOTE — Progress Notes (Signed)
   PRENATAL VISIT NOTE  Subjective:  Christy Durham is a 31 y.o. G2P1001 at [redacted]w[redacted]d being seen today for ongoing prenatal care.  She is currently monitored for the following issues for this low-risk pregnancy and has Supervision of other normal pregnancy, antepartum and Elevated AFP on their problem list.  Patient reports  leakage of clear fluid .  Contractions: Not present. Vag. Bleeding: None.  Movement: Present.   The following portions of the patient's history were reviewed and updated as appropriate: allergies, current medications, past family history, past medical history, past social history, past surgical history and problem list.   Objective:   Vitals:   10/27/21 0922  BP: 120/70  Pulse: 93  Weight: 164 lb (74.4 kg)    Fetal Status: Fetal Heart Rate (bpm): 145   Movement: Present     General:  Alert, oriented and cooperative. Patient is in no acute distress.  Skin: Skin is warm and dry. No rash noted.   Cardiovascular: Normal heart rate noted  Respiratory: Normal respiratory effort, no problems with respiration noted  Abdomen: Soft, gravid, appropriate for gestational age.  Pain/Pressure: Absent     Pelvic: Speculum exam shows pooling of fluid       and positive nitrazine.  Microscope is not functioning to look for ferning.  There is copious amounts of fluid.  Extremities: Normal range of motion.  Edema: None  Mental Status: Normal mood and affect. Normal behavior. Normal judgment and thought content.   Assessment and Plan:  Pregnancy: G2P1001 at [redacted]w[redacted]d with rupture membranes and breech presentation.  Patient not laboring at this time.  Patient to go to MAU and be worked up for C-section today.  Patient aware not to eat or drink anything between now and arrival at the hospital.  Her last oral intake was a little bit of coffee at 6 AM.    Future Appointments  Date Time Provider [place]  10/28/2021  8:00 AM [facility] [place] [facility] None  11/27/2021  9:50 AM  Constant, Vickii Chafe, MD [facility]    Silas [place], MD

## 2021-10-27 NOTE — Anesthesia Procedure Notes (Signed)
Spinal  Patient location during procedure: OR Start time: 10/27/2021 5:09 PM End time: 10/27/2021 5:19 PM Reason for block: surgical anesthesia Staffing Performed: anesthesiologist  Anesthesiologist: Duane Boston, MD Performed by: Duane Boston, MD Authorized by: Duane Boston, MD   Preanesthetic Checklist Completed: patient identified, IV checked, risks and benefits discussed, surgical consent, monitors and equipment checked, pre-op evaluation and timeout performed Spinal Block Patient position: sitting Prep: DuraPrep Patient monitoring: cardiac monitor, continuous pulse ox and blood pressure Approach: midline Location: L2-3 Injection technique: single-shot Needle Needle type: Pencan  Needle gauge: 24 G Needle length: 9 cm Assessment Events: CSF return Additional Notes Functioning IV was confirmed and monitors were applied. Sterile prep and drape, including hand hygiene and sterile gloves were used. The patient was positioned and the spine was prepped. The skin was anesthetized with lidocaine.  Free flow of clear CSF was obtained prior to injecting local anesthetic into the CSF.  The spinal needle aspirated freely following injection.  The needle was carefully withdrawn.  The patient tolerated the procedure well.

## 2021-10-27 NOTE — Op Note (Signed)
Christy Durham PROCEDURE DATE: 10/27/2021  PREOPERATIVE DIAGNOSES: Intrauterine pregnancy at [redacted]w[redacted]d weeks gestation; malpresentation: Breech  POSTOPERATIVE DIAGNOSES: The same, viable infant delivered  PROCEDURE: Primary Low Transverse Cesarean Section with bilateral salpingectomy  SURGEON:  Dr. Mariel Durham  ASSISTANT:  Christy Hawk, DO An experienced assistant was required given the standard of surgical care given the complexity of the case.  This assistant was needed for exposure, dissection, suctioning, retraction, instrument exchange, assisting with delivery with administration of fundal pressure, and for overall help during the procedure.  ANESTHESIOLOGY TEAM: Anesthesiologist: Christy Roberts, MD CRNA: Christy Ewings, CRNA  INDICATIONS: Christy Durham is a 31 y.o. [id number] at [redacted]w[redacted]d here for cesarean section secondary to the indications listed under preoperative diagnoses; please see preoperative note for further details.  The risks of surgery were discussed with the patient including but were not limited to: bleeding which may require transfusion or reoperation; infection which may require antibiotics; injury to bowel, bladder, ureters or other surrounding organs; injury to the fetus; need for additional procedures including hysterectomy in the event of a life-threatening hemorrhage; formation of adhesions; placental abnormalities wth subsequent pregnancies; incisional problems; thromboembolic phenomenon and other postoperative/anesthesia complications.  The patient concurred with the proposed plan, giving informed written consent for the procedure.    FINDINGS:  Viable female infant in cephalic presentation.  Apgars 7, 8 and 10.  Amniotic fluid: clear.  Intact placenta, three vessel cord.  Normal uterus, fallopian tubes and ovaries bilaterally.  ANESTHESIA: spinal INTRAVENOUS FLUIDS: 1300 ml   ESTIMATED BLOOD LOSS: 199 ml URINE OUTPUT:  50 ml SPECIMENS: Placenta sent to L&D .,  Right and left fallopian tubes COMPLICATIONS: None immediate  PROCEDURE IN DETAIL:  The patient preoperatively received intravenous antibiotics and had sequential compression devices applied to her lower extremities.  She was then taken to the operating room where spinal anesthesia was found to be adequate. She was then placed in a dorsal supine position with a leftward tilt, and prepped and draped in a sterile manner.  A foley catheter was  placed into her bladder and attached to constant gravity.  After an adequate timeout was performed, a Pfannenstiel skin incision was made with scalpel and carried through to the underlying layer of fascia. The fascia was incised in the midline, and this incision was extended bluntly. The rectus muscles were separated in the midline and the peritoneum was entered bluntly.   The Alexis self-retaining retractor was introduced into the abdominal cavity.  Attention was turned to the lower uterine segment where a low transverse hysterotomy was made with a scalpel and extended bluntly in caudad and cephalad directions.  The infant was successfully delivered, the cord was clamped and cut after one minute, and the infant was handed over to the awaiting neonatology team. Uterine massage was then administered, and the placenta delivered intact with a three-vessel cord. The uterus was then cleared of clots and debris.  The hysterotomy was closed with 0-Monocryl in a running fashion.   Attention was then turned to the patient's uterus, and left fallopian tube was identified and followed out to the fimbriated end.  Ligasure device was used to cauterize and cut the mesosalpinx to proximal end of the fallopian tube, removing 6cm of tube. A similar process was carried out on the right side allowing for bilateral tubal sterilization.   The pelvis was cleared of all clot and debris. Hemostasis was confirmed on all surfaces. The uterus was once again inspected and found to be hemostatic.  The retractor was removed.  The peritoneum was closed with a 2-0 Vicryl running stitch. The fascia was then closed using 0 Vicryl in a running fashion.  The subcutaneous layer was irrigated, Christy areas of bleeding were cauterized with the bovie,  was found to be hemostatic.. The drape tape was removed to allow for skin suture, and superficial skin layer (2cm x 1cm) was detached with tape removal. The skin was closed with a 4-0 Vicryl subcuticular stitch. The patient tolerated the procedure well. Sponge, instrument and needle counts were correct x 3.  She was taken to the recovery room in stable condition.   Christy Durham, [place] Fellow, Faculty practice [place] [facility] [place] 10/27/21  6:36 PM

## 2021-10-27 NOTE — H&P (Signed)
Faculty Practice H&P  Christy Durham is a 31 y.o. female G2P1001 with IUP at [redacted]w[redacted]d presenting for cesarean section for breech and PROM. She was evaluated in the office earlier today. Pregnancy was been complicated by breech position.    Pt states she has been having no contractions, vaginal bleeding, intact membranes, with normal fetal movement.     Prenatal Course Source of Care: [facility] with onset of care at 10 weeks  Pregnancy complications or risks: Patient Active Problem List   Diagnosis Date Noted   Elevated AFP 06/10/2021   Supervision of other normal pregnancy, antepartum 04/07/2021   She desires bilateral tubal ligation for contraception.  She plans to breastfeed  Prenatal labs and studies: ABO, Rh: --/--/PENDING (09/18 1235) Antibody: PENDING (09/18 1235) Rubella: 1.07 (03/02 0859) RPR: NON-REACTIVE (06/23 0925)  HBsAg: NON-REACTIVE (03/02 0859)  HIV: NON-REACTIVE (06/23 0919)  GBS:    2hr Glucola: negative Genetic screening: normal Anatomy US: normal  Past Medical History: History reviewed. No pertinent past medical history.  Past Surgical History:  Past Surgical History:  Procedure Laterality Date   TONSILLECTOMY      Obstetrical History:  OB History     Gravida  2   Para  1   Term  1   Preterm      AB      Living  1      SAB      IAB      Ectopic      Multiple  0   Live Births  1           Gynecological History:  OB History     Gravida  2   Para  1   Term  1   Preterm      AB      Living  1      SAB      IAB      Ectopic      Multiple  0   Live Births  1           Social History:  Social History   Socioeconomic History   Marital status: Married    Spouse name: Not on file   Number of children: Not on file   Years of education: Not on file   Highest education level: Not on file  Occupational History   Occupation: Human resources officer    Employer: [employer]  Tobacco Use   Smoking status: Never    Smokeless tobacco: Never  Vaping Use   Vaping Use: Never used  Substance and Sexual Activity   Alcohol use: Not Currently   Drug use: Never   Sexual activity: Yes    Partners: Male  Other Topics Concern   Not on file  Social History Narrative   Not on file   Social Determinants of Health   Financial Resource Strain: Not on file  Food Insecurity: Not on file  Transportation Needs: Not on file  Physical Activity: Not on file  Stress: Not on file  Social Connections: Not on file    Family History:  Family History  Problem Relation Age of Onset   Cancer Mother 16       Brease   Heart defect Maternal Uncle     Medications:  Prenatal vitamins,  Current Facility-Administered Medications  Medication Dose Route Frequency Provider Last Rate Last Admin   [START ON 10/28/2021] ceFAZolin (ANCEF) IVPB 2g/100 mL premix  2 g Intravenous On Call to [place],  Rhona Raider, DO       lactated ringers infusion   Intravenous Continuous Levie Heritage, DO        Allergies: No Known Allergies  Review of Systems: - negative  Physical Exam: Blood pressure 126/67, pulse 93, temperature 98.4 F (36.9 C), temperature source Oral, resp. rate 16, height 5\' 5"  (1.651 m), weight 74.3 kg, last menstrual period 01/28/2021, SpO2 98 %. GENERAL: Well-developed, well-nourished female in no acute distress.  LUNGS: Clear to auscultation bilaterally.  HEART: Regular rate and rhythm. ABDOMEN: Soft, nontender, nondistended, gravid. EFW 7 lbs EXTREMITIES: Nontender, no edema, 2+ distal pulses. FHT:  Baseline rate 130 bpm   Variability moderate  Accelerations present   Decelerations none Contractions: Every 8 mins   Pertinent Labs/Studies:   Lab Results  Component Value Date   WBC 9.8 08/01/2021   HGB 12.5 08/01/2021   HCT 36.6 08/01/2021   MCV 99.2 08/01/2021   PLT 227 08/01/2021    Assessment : Christy Durham is a 31 y.o. G2P1001 at [redacted]w[redacted]d being admitted for cesarean section secondary to breech and  PROM.  Plan: The risks of cesarean section discussed with the patient included but were not limited to: bleeding which may require transfusion or reoperation; infection which may require antibiotics; injury to bowel, bladder, ureters or other surrounding organs; injury to the fetus; need for additional procedures including hysterectomy in the event of a life-threatening hemorrhage; placental abnormalities wth subsequent pregnancies, incisional problems, thromboembolic phenomenon and other postoperative/anesthesia complications. The patient concurred with the proposed plan, giving informed written consent for the procedure.   Patient has been NPO since 6am and will remain NPO for procedure.  Preoperative prophylactic Ancef and Azithromycin ordered on call to the OR.    [redacted]w[redacted]d, DO 10/27/2021, 12:53 PM

## 2021-10-28 ENCOUNTER — Encounter (HOSPITAL_COMMUNITY): Payer: Self-pay

## 2021-10-28 ENCOUNTER — Telehealth: Payer: Self-pay | Admitting: *Deleted

## 2021-10-28 ENCOUNTER — Inpatient Hospital Stay (HOSPITAL_COMMUNITY): Payer: BC Managed Care – PPO

## 2021-10-28 ENCOUNTER — Inpatient Hospital Stay (HOSPITAL_COMMUNITY): Admission: RE | Admit: 2021-10-28 | Payer: BC Managed Care – PPO | Source: Home / Self Care | Admitting: Family Medicine

## 2021-10-28 ENCOUNTER — Encounter (HOSPITAL_COMMUNITY): Payer: Self-pay | Admitting: Obstetrics and Gynecology

## 2021-10-28 ENCOUNTER — Inpatient Hospital Stay (HOSPITAL_COMMUNITY): Admit: 2021-10-28 | Payer: BC Managed Care – PPO | Admitting: Family Medicine

## 2021-10-28 LAB — CBC
HCT: 33.3 % — ABNORMAL LOW (ref 36.0–46.0)
Hemoglobin: 11.3 g/dL — ABNORMAL LOW (ref 12.0–15.0)
MCH: 33.9 pg (ref 26.0–34.0)
MCHC: 33.9 g/dL (ref 30.0–36.0)
MCV: 100 fL (ref 80.0–100.0)
Platelets: 211 10*3/uL (ref 150–400)
RBC: 3.33 MIL/uL — ABNORMAL LOW (ref 3.87–5.11)
RDW: 13 % (ref 11.5–15.5)
WBC: 13 10*3/uL — ABNORMAL HIGH (ref 4.0–10.5)
nRBC: 0 % (ref 0.0–0.2)

## 2021-10-28 SURGERY — Surgical Case
Anesthesia: Regional

## 2021-10-28 NOTE — Progress Notes (Signed)
POSTPARTUM PROGRESS NOTE  POD #1  Subjective:  Christy Durham is a 31 y.o. [id number] s/p pLTCS at [redacted]w[redacted]d.  She reports she doing well. No acute events overnight. She reports she is doing well. She denies any problems with ambulating, voiding or po intake. Denies nausea or vomiting. She has not passed flatus. Pain is well controlled.  Lochia is appropriate.  Objective: Blood pressure (!) 103/49, pulse 67, temperature 98.2 F (36.8 C), temperature source Oral, resp. rate 18, height 5\' 5"  (1.651 m), weight 74.3 kg, last menstrual period 01/28/2021, SpO2 98 %, unknown if currently breastfeeding.  Physical Exam:  General: alert, cooperative and no distress Chest: no respiratory distress Heart:regular rate, distal pulses intact Abdomen: soft, nontender,  Uterine Fundus: firm, appropriately tender DVT Evaluation: No calf swelling or tenderness Extremities: no edema Skin: warm, dry; incision clean/dry/intact w/ pressure dressing in place  Recent Labs    10/27/21 1238 10/28/21 0533  HGB 13.3 11.3*  HCT 38.6 33.3*    Assessment/Plan: Christy Durham is a 31 y.o. [id number] s/p pLTCS at [redacted]w[redacted]d for breech presentation.  POD#1 - Doing welll; pain well controlled. H/H appropriate  Routine postpartum care  OOB, ambulated  Lovenox for VTE prophylaxis Anemia: asymptomatic    Contraception: tubal  Feeding: breast  Dispo: Plan for discharge in the next 2 days .   LOS: 1 day   Gifford Shave, MD  10/28/2021, 6:59 AM

## 2021-10-28 NOTE — Lactation Note (Signed)
This note was copied from a baby's chart. Lactation Consultation Note  Patient Name: Christy Durham FGHWE'X Date: 10/28/2021 Reason for consult: Initial assessment;Early term 37-38.6wks Age:30 hours  Visited with family of 62 hours old ETI female, [name] is a P2 and experienced breastfeeding. Offered assistance with latch and mom agreed to do STS with baby "Palmer". Baby latched with ease (see LATCH score) and only needed to be repositioned once. Baby still nursing when exiting the room at the 10 minutes mark. Reviewed normal newborn behavior, feeding cues, size of baby's stomach, lactogenesis II and anticipatory guidelines.  Maternal Data Has patient been taught Hand Expression?: Yes Does the patient have breastfeeding experience prior to this delivery?: Yes How long did the patient breastfeed?: 4-5 months  LATCH Score Latch: Grasps breast easily, tongue down, lips flanged, rhythmical sucking.  Audible Swallowing: A few with stimulation (with compressions)  Type of Nipple: Everted at rest and after stimulation  Comfort (Breast/Nipple): Soft / non-tender (just in the beginning "transient soreness")  Hold (Positioning): Assistance needed to correctly position infant at breast and maintain latch. (minimal assistance needed)  LATCH Score: 8  Interventions Interventions: Breast feeding basics reviewed;Education;LC Services brochure  Plan of care Encouraged putting baby to breast 8-12 times/24 hours or sooner if feeding cues are present Hand expression and spoon feeding were also encouraged  FOB, GOB, mom's sister and big brother present and very supportive. All questions and concerns answered, family to contact [facility] services PRN.  Discharge Pump: Hands Free;Personal (Elvie and Medela DEBP)  Consult Status Consult Status: Follow-up Date: 10/29/21 Follow-up type: In-patient   Christy Durham 10/28/2021, 2:38 PM

## 2021-10-28 NOTE — Transfer of Care (Signed)
Immediate Anesthesia Transfer of Care Note  Patient: Christy Durham  Procedure(s) Performed: CESAREAN SECTION WITH BILATERAL TUBAL LIGATION  Patient Location: PACU  Anesthesia Type:Spinal  Level of Consciousness: awake, alert  and oriented  Airway & Oxygen Therapy: Patient Spontanous Breathing  Post-op Assessment: Report given to RN and Post -op Vital signs reviewed and stable  Post vital signs: Reviewed and stable  Last Vitals:  Vitals Value Taken Time  BP 103/49 10/28/21 0630  Temp 36.8 C 10/28/21 0630  Pulse 67 10/28/21 0630  Resp 18 10/28/21 0630  SpO2 98 % 10/27/21 2100    Last Pain:  Vitals:   10/28/21 0630  TempSrc: Oral  PainSc: 4          Complications: No notable events documented.

## 2021-10-28 NOTE — Telephone Encounter (Signed)
Left patient a message to call and push out 4 week Postpartum appointment to 6 weeks due to having a C/S.

## 2021-10-29 LAB — SURGICAL PATHOLOGY

## 2021-10-29 MED ORDER — IBUPROFEN 600 MG PO TABS: 600.0000 mg | ORAL_TABLET | Freq: Four times a day (QID) | ORAL | 0 refills | Status: AC

## 2021-10-29 MED ORDER — OXYCODONE HCL 5 MG PO TABS: 5.0000 mg | ORAL_TABLET | Freq: Four times a day (QID) | ORAL | 0 refills | Status: AC | PRN

## 2021-10-29 MED ORDER — ACETAMINOPHEN 500 MG PO TABS: 1000.0000 mg | ORAL_TABLET | Freq: Four times a day (QID) | ORAL | 0 refills | Status: AC

## 2021-10-29 NOTE — Discharge Summary (Addendum)
Postpartum Discharge Summary  Date of Service 10/29/21     Patient Name: Christy Durham DOB: February 07, 1991 MRN: [id number]  Date of admission: 10/27/2021 Delivery date:10/27/2021  Delivering provider: Griffin Basil  Date of discharge: 10/29/2021  Admitting diagnosis: Delivery of pregnancy by cesarean section [O82] Intrauterine pregnancy: [redacted]w[redacted]d    Secondary diagnosis:  Principal Problem:   Delivery of pregnancy by cesarean section Active Problems:   Supervision of other normal pregnancy, antepartum  Additional problems: none    Discharge diagnosis: Term Pregnancy Delivered                                              Post partum procedures: none Augmentation: N/A Complications: None  Hospital course: Onset of Labor With Unplanned C/S   31y.o. yo [id number] admitted with PROM on 10/27/2021. Patient had a labor course significant for PROM and breech presentation. The patient went for cesarean section due to Malpresentation. Delivery details as follows: Membrane Rupture Time/Date: 5:51 PM ,10/27/2021   Delivery Method:C-Section, Low Transverse  Details of operation can be found in separate operative note. Patient had an uncomplicated postpartum course.  She is ambulating,tolerating a regular diet, passing flatus, and urinating well.  Patient is discharged home in stable condition 10/29/21.  Newborn Data: Birth date:10/27/2021  Birth time:5:52 PM  Gender:Female  Living status:Living  Apgars:7 ,8  Weight:3050 g   Magnesium Sulfate received: No BMZ received: No Rhophylac:No MMTDS:KAJGOT-DaP:Given prenatally Flu: Yes Transfusion:No  Physical exam  Vitals:   10/28/21 0630 10/28/21 0930 10/28/21 2300 10/29/21 0500  BP: (!) 103/49 (!) 99/58 109/62 110/65  Pulse: 67 72 72 76  Resp: _0 Temp: 98.2 F (36.8 C) 98.7 F (37.1 C) 98.2 F (36.8 C) 98 F (36.7 C)  TempSrc: Oral  Oral   SpO2:  100% 99% 98%  Weight:      Height:       General: alert,  cooperative, and no distress Lochia: appropriate Uterine Fundus: firm Incision: Dressing is clean, dry, and intact DVT Evaluation: No significant calf/ankle edema. Labs: Lab Results  Component Value Date   WBC 13.0 (H) 10/28/2021   HGB 11.3 (L) 10/28/2021   HCT 33.3 (L) 10/28/2021   MCV 100.0 10/28/2021   PLT 211 10/28/2021       No data to display         Edinburgh Score:    11/14/2018    9:29 AM  Edinburgh Postnatal Depression Scale Screening Tool  I have been able to laugh and see the funny side of things. 0  I have looked forward with enjoyment to things. 0  I have blamed myself unnecessarily when things went wrong. 0  I have been anxious or worried for no good reason. 0  I have felt scared or panicky for no good reason. 0  Things have been getting on top of me. 0  I have been so unhappy that I have had difficulty sleeping. 0  I have felt sad or miserable. 0  I have been so unhappy that I have been crying. 0  The thought of harming myself has occurred to me. 0  Edinburgh Postnatal Depression Scale Total 0    After visit meds:  Allergies as of 10/29/2021   No Known Allergies      Medication List  TAKE these medications    acetaminophen 500 MG tablet Commonly known as: TYLENOL Take 2 tablets (1,000 mg total) by mouth every 6 (six) hours.   ibuprofen 600 MG tablet Commonly known as: ADVIL Take 1 tablet (600 mg total) by mouth every 6 (six) hours.   oxyCODONE 5 MG immediate release tablet Commonly known as: Oxy IR/ROXICODONE Take 1 tablet (5 mg total) by mouth every 6 (six) hours as needed for moderate pain.   PRENATAL VITAMIN PO Take 1 tablet by mouth in the morning.               Discharge Care Instructions  (From admission, onward)           Start     Ordered   10/29/21 0000  Leave dressing on - Keep it clean, dry, and intact until clinic visit        10/29/21 0851             Discharge home in stable condition Infant  Feeding: Breast Infant Disposition:home with mother Discharge instruction: per After Visit Summary and Postpartum booklet. Activity: Advance as tolerated. Pelvic rest for 6 weeks.  Diet: routine diet Future Appointments: Future Appointments  Date Time Provider [place]  11/27/2021  9:50 AM Constant, Vickii Chafe, MD [facility]   Follow up Visit:  Please schedule this patient for a In person postpartum visit in 1 week with the following provider: Any provider. Additional Postpartum F/U:Incision check 1 week  Low risk pregnancy complicated by:  none Delivery mode:  C-Section, Low Transverse  Anticipated Birth Control:  BTL done [facility]  10/29/2021 Salvadore Oxford, MD  Gerlene Fee, DO OB Fellow, [place] [facility] [place] 10/29/2021, 9:10 AM

## 2021-10-29 NOTE — Lactation Note (Signed)
This note was copied from a baby's chart. Lactation Consultation Note  Patient Name: Christy Durham SJGGE'Z Date: 10/29/2021 Reason for consult: Follow-up assessment;Early term 37-38.6wks Age:30 hours  P2, Mother complaining of sore nipples. Noted baby has tight mid anterior lingual frenulum. If pain continues, mother will discuss with Ped MD. Provided mother with coconut oil and comfort gels. Reviewed engorgement care and monitoring voids/stools.   Maternal Data How long did the patient breastfeed?:  (Hx masitits)  Feeding Mother's Current Feeding Choice: Breast Milk  LATCH Score Latch: Grasps breast easily, tongue down, lips flanged, rhythmical sucking.  Audible Swallowing: A few with stimulation  Type of Nipple: Everted at rest and after stimulation  Comfort (Breast/Nipple): Filling, red/small blisters or bruises, mild/mod discomfort  Hold (Positioning): No assistance needed to correctly position infant at breast.  LATCH Score: 8   Lactation Tools Discussed/Used  DEBP  Interventions Interventions: Breast feeding basics reviewed;Comfort gels;Coconut oil;Education  Discharge  Reviewed engorgement care and monitoring voids/stools.   Consult Status Consult Status: Complete Date: 10/29/21    Vivianne Master [facility] 10/29/2021, 11:58 AM

## 2021-10-29 NOTE — Discharge Instructions (Signed)
REMOVE DRESSING SATURDAY 09/23! Marland Kitchen)

## 2021-11-03 ENCOUNTER — Ambulatory Visit (INDEPENDENT_AMBULATORY_CARE_PROVIDER_SITE_OTHER): Payer: BC Managed Care – PPO | Admitting: *Deleted

## 2021-11-03 DIAGNOSIS — Z98891 History of uterine scar from previous surgery: Secondary | ICD-10-CM

## 2021-11-03 NOTE — Progress Notes (Signed)
Pt here for a 1 week incision check after C-section.  Are is clean and dry except for a small superficial area on Rt side of incision.  2 steri-strips placed on that area.  No redness noted.  Pt is ambulating well.  Pt to watch the incision for any drainage, otherwise she will return for her PP visit.

## 2021-11-27 ENCOUNTER — Ambulatory Visit: Payer: BC Managed Care – PPO | Admitting: Obstetrics and Gynecology

## 2021-12-09 NOTE — Progress Notes (Signed)
Post Partum Visit Note  Christy Durham is a 31 y.o. G16P2002 female who presents for a postpartum visit. She is 6 weeks postpartum following a primary cesarean section.  I have fully reviewed the prenatal and intrapartum course. The delivery was at 38.6 gestational weeks.  Anesthesia: spinal. Postpartum course has been unremarkable. Baby is doing well. Baby is feeding by breast. Bleeding no bleeding. Bowel function is normal. Bladder function is normal. Patient is not sexually active. Contraception method is tubal ligation. Postpartum depression screening: negative.   The pregnancy intention screening data noted above was reviewed. Potential methods of contraception were discussed. The patient elected to proceed with No data recorded.   Edinburgh Postnatal Depression Scale - 12/10/21 1003       Edinburgh Postnatal Depression Scale:  In the Past 7 Days   I have been able to laugh and see the funny side of things. 0    I have looked forward with enjoyment to things. 0    I have blamed myself unnecessarily when things went wrong. 0    I have been anxious or worried for no good reason. 0    I have felt scared or panicky for no good reason. 0    Things have been getting on top of me. 0    I have been so unhappy that I have had difficulty sleeping. 0    I have felt sad or miserable. 0    I have been so unhappy that I have been crying. 0    The thought of harming myself has occurred to me. 0    Edinburgh Postnatal Depression Scale Total 0             Health Maintenance Due  Topic Date Due   COVID-19 Vaccine (1) Never done    The following portions of the patient's history were reviewed and updated as appropriate: allergies, current medications, past family history, past medical history, past social history, past surgical history, and problem list.  Review of Systems Pertinent items are noted in HPI.  Objective:  BP 124/78   Pulse 67   Resp 16   Ht 5\' 5"  (1.651 m)   Wt 153 lb  (69.4 kg)   Breastfeeding Yes   BMI 25.46 kg/m    General:  alert, cooperative, and no distress  Abdomen: soft, non-tender; bowel sounds normal; no masses,  no organomegaly   Wound well approximated incision       Assessment:   6wk postpartum exam.   Plan:   Essential components of care per ACOG recommendations:  1.  Mood and well being: Patient with negative depression screening today. Reviewed local resources for support.  - Patient tobacco use? No.   - hx of drug use? No.    2. Infant care and feeding:  -Patient currently breastmilk feeding? Yes. Reviewed importance of draining breast regularly to support lactation.  -Social determinants of health (SDOH) reviewed in EPIC. No concerns  3. Sexuality, contraception and birth spacing - Patient does not want a pregnancy in the next year.  Desired family size is 2 children.  - Reviewed reproductive life planning. Reviewed contraceptive methods based on pt preferences and effectiveness.  Patient had BTS  4. Sleep and fatigue -Encouraged family/partner/community support of 4 hrs of uninterrupted sleep to help with mood and fatigue  5. Physical Recovery  - Discussed patients delivery and complications. She describes her labor as good. - Patient had a C-section.  - Patient has urinary incontinence?  No. - Patient is safe to resume physical and sexual activity  6.  Health Maintenance - HM due items addressed Yes - Last pap smear  Diagnosis  Date Value Ref Range Status  10/21/2020   Final   - Negative for intraepithelial lesion or malignancy (NILM)   Pap smear not done at today's visit.  -Breast Cancer screening indicated? No.   7. Chronic Disease/Pregnancy Condition follow up: None - PCP follow up prn  Milas Hock, MD Center for [facility], [facility]

## 2021-12-10 ENCOUNTER — Ambulatory Visit (INDEPENDENT_AMBULATORY_CARE_PROVIDER_SITE_OTHER): Payer: BC Managed Care – PPO | Admitting: Obstetrics and Gynecology

## 2021-12-10 ENCOUNTER — Encounter: Payer: Self-pay | Admitting: Obstetrics and Gynecology

## 2022-11-24 ENCOUNTER — Telehealth: Payer: Self-pay | Admitting: *Deleted

## 2022-11-24 NOTE — Telephone Encounter (Signed)
Left patient a message to call and schedule annual that is due after 12/11/2022.

## 2022-11-25 DIAGNOSIS — H9201 Otalgia, right ear: Secondary | ICD-10-CM | POA: Diagnosis not present
